# Patient Record
Sex: Female | Born: 1940 | Race: White | Hispanic: No | Marital: Married | Smoking: Never smoker
Health system: Southern US, Community
[De-identification: ages and names within clinical notes are randomized; demographics above are authoritative.]

## PROBLEM LIST (undated history)

## (undated) DIAGNOSIS — M1990 Unspecified osteoarthritis, unspecified site: Secondary | ICD-10-CM

## (undated) DIAGNOSIS — I10 Essential (primary) hypertension: Secondary | ICD-10-CM

## (undated) DIAGNOSIS — I714 Abdominal aortic aneurysm, without rupture, unspecified: Secondary | ICD-10-CM

## (undated) HISTORY — DX: Unspecified osteoarthritis, unspecified site: M19.90

## (undated) HISTORY — DX: Abdominal aortic aneurysm, without rupture, unspecified: I71.40

## (undated) HISTORY — DX: Abdominal aortic aneurysm, without rupture: I71.4

## (undated) HISTORY — PX: KNEE SURGERY: SHX244

## (undated) HISTORY — DX: Essential (primary) hypertension: I10

---

## 2002-05-09 ENCOUNTER — Encounter: Payer: Self-pay | Admitting: Orthopedic Surgery

## 2002-05-14 ENCOUNTER — Inpatient Hospital Stay (HOSPITAL_COMMUNITY): Admission: RE | Admit: 2002-05-14 | Discharge: 2002-05-16 | Payer: Self-pay | Admitting: Orthopedic Surgery

## 2002-05-14 ENCOUNTER — Encounter: Payer: Self-pay | Admitting: Orthopedic Surgery

## 2004-03-06 HISTORY — PX: JOINT REPLACEMENT: SHX530

## 2005-03-06 HISTORY — PX: SPINE SURGERY: SHX786

## 2005-03-06 HISTORY — PX: BACK SURGERY: SHX140

## 2009-08-17 ENCOUNTER — Encounter: Admission: RE | Admit: 2009-08-17 | Discharge: 2009-08-17 | Payer: Self-pay | Admitting: Orthopedic Surgery

## 2009-10-18 ENCOUNTER — Ambulatory Visit: Payer: Self-pay | Admitting: Surgery

## 2010-07-19 ENCOUNTER — Other Ambulatory Visit: Payer: Self-pay | Admitting: Surgery

## 2010-07-19 DIAGNOSIS — I714 Abdominal aortic aneurysm, without rupture: Secondary | ICD-10-CM

## 2010-07-19 NOTE — Assessment & Plan Note (Signed)
OFFICE VISIT   Tiffany Dunn, Tiffany Dunn  DOB:  08-27-1940                                       10/18/2009  VWUJW#:[id number]   REASON FOR VISIT:  Aneurysm.   REFERRING PHYSICIAN:  Windy Fast A. Darrelyn Hillock, M.D.   PRIMARY CARE PHYSICIAN:  Dr. Rosalio Macadamia   HISTORY:  This is a 70 year old female seen at the request of Dr.  Darrelyn Hillock for evaluation of aortic aneurysm.  The patient was picked up  incidentally with imaging.  MRA suggests an aortic root measuring 3.5  cm, distal aortic arch at 2.9 cm, proximal thoracic aorta at 3.1 cm, and  a 2.7-cm juxtarenal visceral aorta.  The patient has no symptoms related  to her aneurysm.  She has very minimal risk factors.  She has minimal  hypertension which does not need medical treatment.  Per the patient,  her cholesterol has been well controlled and she is not a smoker.   REVIEW OF SYSTEMS:  Positive only for arthritis.  All other review of  systems are negative.   PAST MEDICAL HISTORY:  History of knee and back surgery.   SOCIAL HISTORY:  She is married.  She is retired.  Does not drink or  smoke.   FAMILY HISTORY:  Positive for hypertension in her father.   MEDICATIONS:  1. Fish Oil.  2. Baby aspirin.  3. Aleve.  4. Vitamin D and calcium.   ALLERGIES:  ULTRAM.   PHYSICAL EXAMINATION:  Heart rate 63, blood pressure 171/94, respiratory  rate 20.  General:  Well-appearing, no distress.  HEENT:  Within normal  limits.  Lungs:  Clear bilaterally.  Cardiovascular:  Regular rhythm.  No carotid bruits.  No pulsatile abdominal mass.  Abdomen:  Soft,  nontender.  No hepatosplenomegaly.  Musculoskeletal:  Without major  deformities.  Neuro:  She has no focal deficits or weakness.  Skin:  Without rash.  Extremities:  Warm and well perfused.   ASSESSMENT/PLAN:  Thoracic and aortic aneurysmal degeneration; largest  diameter is 3.1 cm.   PLAN:  I discussed the significance of the above findings.  The high  risk of rupture is  extremely low at this size.  We will need to follow  this over time as it could progress.  I told her I would like to see her  back in 1 year with a CT angiogram of the chest, abdomen, and pelvis.     Jorge Ny, MD  Electronically Signed   VWB/MEDQ  D:  10/18/2009  T:  10/19/2009  Job:  [id number]   cc:   Georges Lynch. Darrelyn Hillock, M.D.  Rosalio Macadamia

## 2010-07-22 NOTE — H&P (Signed)
NAME:  Tiffany Dunn, Tiffany Dunn                           ACCOUNT NO.:  [id number]   MEDICAL RECORD NO.:  [id number]                   PATIENT TYPE:  INP   LOCATION:  NA                                   FACILITY:  [facility]   PHYSICIAN:  Georges Lynch. Darrelyn Hillock, M.D.             DATE OF BIRTH:  02/21/1941   DATE OF ADMISSION:  05/14/2002  DATE OF DISCHARGE:                                HISTORY & PHYSICAL   PRIMARY CARE PHYSICIAN:  Dr. Danne Harbor in [place].   HISTORY:  The patient has had low back pain for several months.  The patient  radiates into both buttocks and down the posterior aspects of both thighs.  The pain is worse with standing.  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  Due to  increasing pain and difficulty ambulating, the patient had elected to  proceed with surgery.   ALLERGIES:  ULTRAM causes nausea and vomiting.   MEDICATIONS:  1. Fosamax 70 mg weekly.  2. Vioxx 25 mg daily.   PAST MEDICAL HISTORY:  1. Osteoporosis.  2. Osteoarthritis.   PAST SURGICAL HISTORY:  1. The patient had removal of a benign left breast cyst in 1984.  2. D&C in 1989.  3. Knee surgery in 2002.   FAMILY HISTORY:  Father had heart disease.  Mother died of a stroke.   REVIEW OF SYSTEMS:  GENERAL:  Denies weight change, fever, chills, fatigue.  HEENT:  Denies headache, visual changes, tinnitus, hearing loss, or sore  throat, hoarseness.  CARDIOVASCULAR:  Denies chest pain, palpitations,  shortness of breath, orthopnea.  PULMONARY:  Denies dyspnea, wheezing,  cough, sputum production, or hemoptysis.  GI:  Denies dysphagia, nausea,  vomiting, hematemesis, or abdominal pain. GU:  Denies dysuria, frequency,  urgency, or  hematuria.  ENDOCRINE:  Denies polyuria, polydipsia, appetite  change, heat or cold intolerance.  MUSCULOSKELETAL:  Back exam:  The patient  does have low back pain radiating into her buttocks. NEUROLOGICAL:  Denies  dizziness, vertigo, syncope, or seizure, weakness.  SKIN:  Denies itching,  rashes, masses, or moles.   PHYSICAL EXAMINATION:  VITAL SIGNS:  Temperature 97.3, pulse 72,  respirations 18, blood pressure 110/70 right arm sitting.  GENERAL:  A 70 year old female in no acute distress.  HEENT:  PERRL, EOMs intact, pharynx clear, TMs intact.  NECK:  Supple without masses, no carotid bruits detected.  CHEST:  Clear to auscultation bilaterally.  No wheezing, rales, or rhonchi  noted.  HEART:  Regular rate and rhythm without murmur, gallop, or rub.  ABDOMEN:  Positive bowel sounds.  Soft, nontender.  No organomegaly or  abnormal masses.  BACK:  The patient has decreased range of motion, pain in the lumbar area.  Straight leg raise is negative bilaterally.  NEUROLOGICAL:  She is intact.  Ankle jerks are absent bilaterally.  Knee  jerks  are intact and equal bilaterally.  SKIN:  Warm and dry.   IMAGING STUDIES:  X-ray LS spine she has old compression fracture T12.  MRI  of LS spine reveals severe L4-L5 stenosis with a large synovial cyst L4-L5.   PLAN:  The patient is to be admitted to [facility] May 14, 2002  for a central decompression L4-L5.     Ebbie Ridge. Paitsel, P.A.                     Ronald A. Darrelyn Hillock, M.D.    Tilden Dome  D:  05/12/2002  T:  05/12/2002  Job:  [id number]

## 2010-07-22 NOTE — Op Note (Signed)
NAME:  Tiffany Dunn, Tiffany Dunn                           ACCOUNT NO.:  [id number]   MEDICAL RECORD NO.:  [id number]                   PATIENT TYPE:  INP   LOCATION:  0004                                 FACILITY:  [facility]   PHYSICIAN:  Georges Lynch. Darrelyn Hillock, M.D.             DATE OF BIRTH:  23-Oct-1940   DATE OF PROCEDURE:  05/14/2002  DATE OF DISCHARGE:                                 OPERATIVE REPORT   SURGEON:  Georges Lynch. Darrelyn Hillock, M.D.   ASSISTANT:  Jene Every, M.D.   PREOPERATIVE DIAGNOSES:  1. 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  2. Severe spinal stenosis at L4-5.   POSTOPERATIVE DIAGNOSES:  1. 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  2. Severe spinal stenosis at L4-5.   OPERATION:  1. Bilateral complete decompressive lumbar laminectomy at L4-5.  2. Excision of a large synovial cyst at L4-5 on the right.   DESCRIPTION OF PROCEDURE:  Under general anesthesia, a routine orthopedic  prep and draping of the lower back was carried out.  The patient had 1 g of  IV Ancef.  Two needles were placed in the back for localization purposes,  and x-ray was taken.  Follow up x-ray also was taken to further localize the  area at L4-5.  Once I identified the L4 and L5 spinous processes, I stripped  the muscle from the lamina and spinous processes in the usual fashion.  We  did take another x-ray at this time.  I then inserted the [facility]  retractors.  I then remove the spinous processes of L4, a portion of L3  above, and a portion of L5 below.  I did a complete central decompression  for severe spinal stenosis.  Microscope was brought in.  We then removed the  ligamentum flavum.  We then noticed that on the right side, there was a  severe compression of the thecal sac secondary to a large synovial cyst.  We  gently dissected the cyst from the thecal sac and then did a wide  decompression of the lateral recess and foraminotomies and removed the cyst  in toto.  We then did  foraminotomies and decompressed the recess on the  opposite side.  The wound was thoroughly irrigated.  Good hemostasis was  maintained.  I then loosely applied some thrombin-soaked Gelfoam.  We then  inserted a quarter-inch Penrose drain.  Following this, the wound then was  closed in layers in the usual fashion.  Sterile dressings were applied.  Note, I left a small portion of the deep portion of the fascia open in order  for drainage as well.  Sterile dressings were finally put on after the skin  was closed, and the patient left the operating room in satisfactory  condition.  Prior to leaving surgery, she had 30 mg of IV Toradol.  Ronald A. Darrelyn Hillock, M.D.    RAG/MEDQ  D:  05/14/2002  T:  05/14/2002  Job:  [id number]

## 2010-09-26 ENCOUNTER — Encounter: Payer: Self-pay | Admitting: Surgery

## 2010-10-17 ENCOUNTER — Inpatient Hospital Stay: Admission: RE | Admit: 2010-10-17 | Payer: Self-pay | Source: Ambulatory Visit

## 2010-10-17 ENCOUNTER — Ambulatory Visit: Payer: Self-pay | Admitting: Surgery

## 2010-10-24 ENCOUNTER — Encounter: Payer: Self-pay | Admitting: Surgery

## 2010-11-18 ENCOUNTER — Encounter: Payer: Self-pay | Admitting: Surgery

## 2010-11-21 ENCOUNTER — Ambulatory Visit (INDEPENDENT_AMBULATORY_CARE_PROVIDER_SITE_OTHER): Payer: Medicare Other | Admitting: Surgery

## 2010-11-21 ENCOUNTER — Encounter: Payer: Self-pay | Admitting: Surgery

## 2010-11-21 ENCOUNTER — Encounter (INDEPENDENT_AMBULATORY_CARE_PROVIDER_SITE_OTHER): Payer: Medicare Other | Admitting: Vascular Surgery

## 2010-11-21 VITALS — BP 151/72 | HR 66 | Ht 63.0 in | Wt 170.0 lb

## 2010-11-21 DIAGNOSIS — I712 Thoracic aortic aneurysm, without rupture, unspecified: Secondary | ICD-10-CM

## 2010-11-21 NOTE — Progress Notes (Unsigned)
AAA U/S performed VVS 11/21/2010

## 2010-11-21 NOTE — Progress Notes (Signed)
CC:  Thoracic and abdominal aneurysm HPI:  This is a 70 year old female that comes back today for followup for thoracic and abdominal aortic aneurysms. I saw her one year ago at the request of Dr.Gioffre. She had undergone a MRA which suggested aortic root dilation of 3.5 cm. The distal aortic arch measured 2.9 cm and the proximal thoracic aorta measured 3.1 cm. 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 The patient is back today for followup. She's had no new complaints. Her only risk factor has been hypertension which has been medically managed. She's recently started on ACE inhibitor. Her cholesterol has been well-controlled and she is a nonsmoker.  ROS:  No change from 10/18/2009 visit  Past Medical History  Diagnosis Date  . Arthritis   . AAA (abdominal aortic aneurysm)   . Hypertension     History  Substance Use Topics  . Smoking status: Never Smoker   . Smokeless tobacco: Not on file  . Alcohol Use: No    Family History  Problem Relation Age of Onset  . Heart disease Father     Allergies  Allergen Reactions  . Ultram (Tramadol Hcl)     Current outpatient prescriptions:aspirin 81 MG tablet, Take 81 mg by mouth daily.  , Disp: , Rfl: ;  Calcium Carbonate-Vit D-Min (CALCIUM 1200 PO), Take by mouth. 1200 mg , Disp: , Rfl: ;  lisinopril (PRINIVIL,ZESTRIL) 20 MG tablet, Take 20 mg by mouth daily.  , Disp: , Rfl: ;  Naproxen Sodium (ALEVE PO), Take by mouth. two , Disp: , Rfl: ;  Nutritional Supplements (VITAMIN D BOOSTER PO), Take by mouth. 600 mg , Disp: , Rfl:  Omega-3 Fatty Acids (FISH OIL) 1200 MG CAPS, Take by mouth.  , Disp: , Rfl:   BP 151/72  Pulse 66  Ht 5\' 3"  (1.6 m)  Wt 170 lb (77.111 kg)  BMI 30.11 kg/m2  SpO2 98%  Body mass index is 30.11 kg/(m^2).   Physical exam: General: Well-appearing in no acute distress HEENT: Within normal limits Cardiovascular: Regular rate and rhythm no murmur no carotid bruits pedal pulses are not palpable Abdomen: Soft  nontender Pulmonary: Clear to auscultation no wheezes or rhonchi  Diagnostic studies: Duplex was performed today that showed maximum aortic diameter of 2.7 cm.  Assessment: Thoracic and abdominal aortic aneurysm Plan: The patient was denied by her insurance coming to have CT imaging of her thoracic aorta. There is no other way to evaluate her thoracic aneurysm and so and therefore re\re ordering this to be done in one year to see if her aneurysm has increased in size. Maximum diameter by MR was 3.1 cm. I will continue to follow her abdominal aneurysm with the ultrasound. I'll see her back in a year

## 2010-11-21 NOTE — Progress Notes (Signed)
Addended by: Sharee Pimple on: 11/21/2010 11:06 AM   Modules accepted: Orders

## 2010-12-05 NOTE — Procedures (Unsigned)
DUPLEX ULTRASOUND OF ABDOMINAL AORTA  INDICATION:  Followup thoracic aortic aneurysm.  HISTORY: Diabetes:  No. Cardiac:  No. Hypertension:  Yes. Smoking: Connective Tissue Disorder: Family History:  No. Previous Surgery:  No.  DUPLEX EXAM:         AP (cm)                   TRANSVERSE (cm) Proximal             2.81 cm                   2.68 cm Mid                  2.71 cm                   2.61 cm Distal               1.89 cm                   1.77 cm Right Iliac          1.04 cm                   1.12 cm Left Iliac           0.90 cm                   1.19 cm  PREVIOUS:  Date:  MRA from 2011 juxtarenal  AP:  2.7  TRANSVERSE:  IMPRESSION:  No evidence of abdominal aortic aneurysmal dilatations identified.  ___________________________________________ V. Charlena Cross, MD  SH/MEDQ  D:  11/21/2010  T:  11/21/2010  Job:  [id number]

## 2011-10-19 ENCOUNTER — Other Ambulatory Visit: Payer: Self-pay | Admitting: *Deleted

## 2011-10-19 DIAGNOSIS — I712 Thoracic aortic aneurysm, without rupture: Secondary | ICD-10-CM

## 2011-10-19 DIAGNOSIS — Z48812 Encounter for surgical aftercare following surgery on the circulatory system: Secondary | ICD-10-CM

## 2011-11-20 ENCOUNTER — Ambulatory Visit: Payer: Medicare Other | Admitting: Surgery

## 2011-11-20 ENCOUNTER — Other Ambulatory Visit: Payer: Medicare Other

## 2011-11-23 ENCOUNTER — Other Ambulatory Visit: Payer: Self-pay | Admitting: Surgery

## 2011-11-24 ENCOUNTER — Encounter: Payer: Self-pay | Admitting: Surgery

## 2011-11-27 ENCOUNTER — Ambulatory Visit
Admission: RE | Admit: 2011-11-27 | Discharge: 2011-11-27 | Disposition: A | Discharge status: Home / Self Care | Payer: Medicare Other | Source: Ambulatory Visit | Attending: Surgery | Admitting: Surgery

## 2011-11-27 ENCOUNTER — Ambulatory Visit (INDEPENDENT_AMBULATORY_CARE_PROVIDER_SITE_OTHER): Payer: Medicare Other | Admitting: Surgery

## 2011-11-27 ENCOUNTER — Other Ambulatory Visit: Payer: Self-pay | Admitting: *Deleted

## 2011-11-27 ENCOUNTER — Encounter: Payer: Self-pay | Admitting: Surgery

## 2011-11-27 ENCOUNTER — Ambulatory Visit: Payer: Medicare Other | Admitting: Surgery

## 2011-11-27 ENCOUNTER — Encounter (INDEPENDENT_AMBULATORY_CARE_PROVIDER_SITE_OTHER): Payer: Medicare Other | Admitting: *Deleted

## 2011-11-27 VITALS — BP 143/74 | HR 57 | Resp 16 | Ht 62.0 in | Wt 182.8 lb

## 2011-11-27 DIAGNOSIS — I714 Abdominal aortic aneurysm, without rupture, unspecified: Secondary | ICD-10-CM | POA: Insufficient documentation

## 2011-11-27 DIAGNOSIS — I712 Thoracic aortic aneurysm, without rupture, unspecified: Secondary | ICD-10-CM

## 2011-11-27 DIAGNOSIS — Z48812 Encounter for surgical aftercare following surgery on the circulatory system: Secondary | ICD-10-CM

## 2011-11-27 DIAGNOSIS — I771 Stricture of artery: Secondary | ICD-10-CM

## 2011-11-27 MED ORDER — IOHEXOL 350 MG/ML SOLN
80.0000 mL | Freq: Once | INTRAVENOUS | Status: AC | PRN
  Administered 2011-11-27: 80 mL via INTRAVENOUS

## 2011-11-27 NOTE — Progress Notes (Signed)
[facility] and [facility] Specialist of [facility]   Patient name: Tiffany Dunn MRN: [id number] DOB: January 12, 1941 Sex: female     Chief Complaint  Patient presents with  . AAA    One year f/up  TAA/AAA with vascular lab study today    HISTORY OF PRESENT ILLNESS: The patient is back today for followup of her thoracic and abdominal aortic aneurysm. These were detected when she underwent a MRA. The graft was brought risk factors include hypertension. She is medically managed for this. Her cholesterol is been well controlled and she is a nonsmoker. Duplex in the past has shown a abdominal aortic aneurysm with maximum diameter of 2.7. There was aortic root dilation of 3.5 cm and a thoracic aneurysm with maximum diameter of 3.1 cm. 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 She has had no complaints since I last saw her.  Past Medical History  Diagnosis Date  . Arthritis   . AAA (abdominal aortic aneurysm)   . Hypertension     Past Surgical History  Procedure Date  . Back surgery   . Knee surgery   . Spine surgery 2007    Back  . Joint replacement 2006    Bilateral Knee    History   Social History  . Marital Status: Married    Spouse Name: N/A    Number of Children: N/A  . Years of Education: N/A   Occupational History  . Not on file.   Social History Main Topics  . Smoking status: Never Smoker   . Smokeless tobacco: Not on file  . Alcohol Use: No  . Drug Use: No  . Sexually Active:    Other Topics Concern  . Not on file   Social History Narrative  . No narrative on file    Family History  Problem Relation Age of Onset  . Heart disease Father   . Heart attack Father   . Hypertension Father     Allergies as of 11/27/2011 - Review Complete 11/27/2011  Allergen Reaction Noted  . Ultram (tramadol hcl) Anaphylaxis and Nausea And Vomiting 09/26/2010    Current Outpatient Prescriptions on File Prior to Visit  Medication Sig Dispense Refill  . aspirin 81  MG tablet Take 81 mg by mouth daily.        . Calcium Carbonate-Vit D-Min (CALCIUM 1200 PO) Take by mouth. 1200 mg       . lisinopril (PRINIVIL,ZESTRIL) 20 MG tablet Take 20 mg by mouth daily.        . Naproxen Sodium (ALEVE PO) Take by mouth. two       . Nutritional Supplements (VITAMIN D BOOSTER PO) Take by mouth. 600 mg       . Omega-3 Fatty Acids (FISH OIL) 1200 MG CAPS Take by mouth.         No current facility-administered medications on file prior to visit.     REVIEW OF SYSTEMS: No change from prior visit  PHYSICAL EXAMINATION:   Vital signs are BP 143/74  Pulse 57  Resp 16  Ht 5\' 2"  (1.575 m)  Wt 182 lb 12.8 oz (82.918 kg)  BMI 33.43 kg/m2  SpO2 95% General: The patient appears their stated age. HEENT:  No gross abnormalities Pulmonary:  Non labored breathing Abdomen: Soft and non-tender Musculoskeletal: There are no major deformities. Neurologic: No focal weakness or paresthesias are detected, Skin: There are no ulcer or rashes noted. Psychiatric: The patient has normal affect. Cardiovascular: There is a regular  rate and rhythm without significant murmur appreciated. I cannot palpate pedal pulses. No carotid bruits.   Diagnostic Studies  duplex ultrasound: 3.6 cm juxtarenal abdominal aortic aneurysm CT angiogram: AB right subclavian without aneurysmal changes maximum descending thoracic aneurysm is 3.7. Distal thoracic aorta measures 3.4 cm the supraceliac aorta measures 2.9 cm the juxtarenal aorta is normal in caliber measuring 2.7 cm.  Assessment: Thoracic aortic aneurysm. 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 She does have aneurysmal changes to the descending thoracic aorta with maximum diameter of 3.7 cm. By CT scan I do not see evidence of a juxtarenal aneurysm, but rather a distal thoracic aneurysm. Again maximum diameter of the thoracic aorta 3.7 cm. There has been a  small increase in size of her last study. [place] this can be evaluated is with a CT scan. I am going to try and minimize the amount of radiation exposure she gets and therefore I will await to get additional imaging for 2 years. At that time she will have a CT angiogram of the chest. She will also get an ultrasound of her, aorta. I will see her back in 2 years  V. Charlena Cross, M.D. [facility] [place] Office: [phone] Pager:  [phone]

## 2011-11-27 NOTE — Addendum Note (Signed)
Addended by: Sharee Pimple on: 11/27/2011 01:36 PM   Modules accepted: Orders

## 2013-03-18 ENCOUNTER — Other Ambulatory Visit: Payer: Self-pay | Admitting: *Deleted

## 2013-03-18 DIAGNOSIS — I716 Thoracoabdominal aortic aneurysm, without rupture, unspecified: Secondary | ICD-10-CM

## 2013-03-18 DIAGNOSIS — Z48812 Encounter for surgical aftercare following surgery on the circulatory system: Secondary | ICD-10-CM

## 2013-03-28 ENCOUNTER — Telehealth: Payer: Self-pay | Admitting: Surgery

## 2013-03-28 NOTE — Telephone Encounter (Signed)
Message copied by Margaretmary EddyFITZPATRICK, DOROTHY K on Fri Mar 28, 2013 10:47 AM ------      Message from: Fredrich BirksMILLIKAN, DANA P      Created: Fri Mar 28, 2013 10:15 AM      Regarding: Questioning Appointment       Olegario MessierKathy,            Please call Tiffany HomerMary Dunn regarding an appointment that was scheduled for 09/2013 with Rosalita ChessmanSuzanne Nickel. She states that no one called her about this appointment and she would like further explanation. She can be reached at [phone].            Thank you,      Annabelle Harmanana ------

## 2013-03-28 NOTE — Telephone Encounter (Signed)
Spoke with pt. Explained that insurance requires pt to be seen when gap between CTA visits are longer than 1 year. Pt confirmed understanding.

## 2013-09-08 ENCOUNTER — Ambulatory Visit: Payer: Self-pay | Admitting: Surgery

## 2013-09-19 ENCOUNTER — Encounter: Payer: Self-pay | Admitting: Family

## 2013-09-22 ENCOUNTER — Ambulatory Visit (INDEPENDENT_AMBULATORY_CARE_PROVIDER_SITE_OTHER): Payer: Medicare Other | Admitting: Family

## 2013-09-22 ENCOUNTER — Encounter: Payer: Self-pay | Admitting: Family

## 2013-09-22 VITALS — BP 133/73 | HR 58 | Temp 97.4°F | Resp 16 | Ht 62.5 in | Wt 184.8 lb

## 2013-09-22 DIAGNOSIS — I714 Abdominal aortic aneurysm, without rupture, unspecified: Secondary | ICD-10-CM

## 2013-09-22 NOTE — Progress Notes (Signed)
[facility] [place]  Established Abdominal Aortic Aneurysm  History of Present Illness  Tiffany Dunn is a 73 y.o. ([birth year]) female patient of Dr. Myra Gianotti The patient is back today for her 2 year followup of her thoracic and abdominal aortic aneurysm. These were detected when she underwent a MRA.  She is to have a CT angiogram of the chest and  an ultrasound of her, aorta as planned 2 years ago when she saw Dr. Myra Gianotti.  She states she is scheduled in September for CT angiogram of her chest and to see Dr. Myra Gianotti later that day.  She did not call to schedule this appointment and is not having any problems. On investigation of documentation it appears that that insurance requires pt to be seen when gap between CTA visits are longer than 1 year.  Her cholesterol is been well controlled and she is a nonsmoker. Duplex in the past has shown a abdominal aortic aneurysm with maximum diameter of 2.7. There was aortic root dilation of 3.5 cm and a thoracic aneurysm with maximum diameter of 3.1 cm. She exercises 3-4 times/week. She states her HDL is high, in the 80's, and therefore does not need a statin.    The patient does denies back, chest, or abdominal pain.   She does report OA pain in her shoulders and c-spine, has some tingling and numbness in her right fingertips.  The patient denies claudication in legs with walking, denies non healing wounds. The patient denies history of stroke or TIA symptoms, denies cardiac problems. She takes a daily ASA and no other antiplatelet or anticoagulant medication.  Pt Diabetic: No   Past Medical History  Diagnosis Date  . Arthritis   . AAA (abdominal aortic aneurysm)   . Hypertension    Past Surgical History  Procedure Laterality Date  . Back surgery    . Knee surgery    . Spine surgery  2007    Back  . Joint replacement  2006    Bilateral Knee   Social History History   Social History  . Marital Status: Married   Spouse Name: N/A    Number of Children: N/A  . Years of Education: N/A   Occupational History  . Not on file.   Social History Main Topics  . Smoking status: Never Smoker   . Smokeless tobacco: Not on file  . Alcohol Use: No  . Drug Use: No  . Sexual Activity:    Other Topics Concern  . Not on file   Social History Narrative  . No narrative on file   Family History Family History  Problem Relation Age of Onset  . Heart disease Father   . Heart attack Father   . Hypertension Father     Current Outpatient Prescriptions on File Prior to Visit  Medication Sig Dispense Refill  . aspirin 81 MG tablet Take 81 mg by mouth daily.        . Calcium Carbonate-Vit D-Min (CALCIUM 1200 PO) Take by mouth. 1200 mg       . lisinopril (PRINIVIL,ZESTRIL) 20 MG tablet Take 20 mg by mouth daily.        . Nutritional Supplements (VITAMIN D BOOSTER PO) Take by mouth. 600 mg       . Omega-3 Fatty Acids (FISH OIL) 1200 MG CAPS Take by mouth.        . Naproxen Sodium (ALEVE PO) Take by mouth. two        No  current facility-administered medications on file prior to visit.   Allergies  Allergen Reactions  . Ultram [Tramadol Hcl] Anaphylaxis and Nausea And Vomiting    ROS: See HPI for pertinent positives and negatives.  Physical Examination  Filed Vitals:   09/22/13 1309  BP: 133/73  Pulse: 58  Temp: 97.4 F (36.3 C)  Resp: 16  Height: 5' 2.5" (1.588 m)  Weight: 184 lb 12.8 oz (83.825 kg)   Body mass index is 33.24 kg/(m^2).  General: A&O x 3, WD, obese female.  Pulmonary: Sym exp, good air movt, CTAB, no rales, rhonchi, or wheezing.  Cardiac: RRR, Nl S1, S2, no detected murmur.   Carotid Bruits Left Right   Negative Negative   Aorta is not palpable Radial pulses are 2+ and =                          VASCULAR EXAM:                                                                                                         LE Pulses LEFT RIGHT       FEMORAL  palpable  palpable         POPLITEAL  not palpable   not palpable       POSTERIOR TIBIAL  not palpable   not palpable        DORSALIS PEDIS      ANTERIOR TIBIAL 1+ palpable  faintly palpable     Gastrointestinal: soft, NTND, -G/R, - HSM, - masses, - CVAT B.  Musculoskeletal: M/S 5/5 throughout, Extremities without ischemic changes.  Neurologic: CN 2-12 grossly intact, Pain and light touch intact in extremities are intact except, Motor exam as listed above.   Medical Decision Making  The patient is a 73 y.o. female who presents with asymptomatic AAA. She states that she is scheduled [facility], and scheduled to see Dr. Myra GianottiBrabham later that day. If she is not already scheduled to have a AAA Duplex [facility] the same day as her chest CT, will schedule her in our lab for this the same day.    Consideration for repair of AAA would be made when the size approaches 4.8 or 5.0 cm, growth > 1 cm/yr, and symptomatic status.  I emphasized the importance of maximal medical management including strict control of blood pressure, blood glucose, and lipid levels, antiplatelet agents, obtaining regular exercise, and continued cessation of smoking.   The patient was given information about AAA including signs, symptoms, treatment, and how to minimize the risk of enlargement and rupture of aneurysms.    The patient was advised to call 911 should the patient experience sudden onset abdominal, chest, or back pain.   Thank you for allowing us to participate in this patient's care.  Charisse MarchSuzanne Atalia Litzinger, RN, MSN, FNP-C [facility] [place] Office: [phone]  Clinic Physician: Imogene BurnChen  09/22/2013, 1:15 PM

## 2013-09-22 NOTE — Patient Instructions (Signed)
Abdominal Aortic Aneurysm An aneurysm is a weakened or damaged part of an artery wall that bulges from the normal force of blood pumping through the body. An abdominal aortic aneurysm is an aneurysm that occurs in the lower part of the aorta, the main artery of the body.  The major concern with an abdominal aortic aneurysm is that it can enlarge and burst (rupture) or blood can flow between the layers of the wall of the aorta through a tear (aorticdissection). Both of these conditions can cause bleeding inside the body and can be life threatening unless diagnosed and treated promptly. CAUSES  The exact cause of an abdominal aortic aneurysm is unknown. Some contributing factors are:   A hardening of the arteries caused by the buildup of fat and other substances in the lining of a blood vessel (arteriosclerosis).  Inflammation of the walls of an artery (arteritis).   Connective tissue diseases, such as Marfan syndrome.   Abdominal trauma.   An infection, such as syphilis or staphylococcus, in the wall of the aorta (infectious aortitis) caused by bacteria. RISK FACTORS  Risk factors that contribute to an abdominal aortic aneurysm may include:  Age older than 60 years.   High blood pressure (hypertension).  Female gender.  Ethnicity (white race).  Obesity.  Family history of aneurysm (first degree relatives only).  Tobacco use. PREVENTION  The following healthy lifestyle habits may help decrease your risk of abdominal aortic aneurysm:  Quitting smoking. Smoking can raise your blood pressure and cause arteriosclerosis.  Limiting or avoiding alcohol.  Keeping your blood pressure, blood sugar level, and cholesterol levels within normal limits.  Decreasing your salt intake. In somepeople, too much salt can raise blood pressure and increase your risk of abdominal aortic aneurysm.  Eating a diet low in saturated fats and cholesterol.  Increasing your fiber intake by including  whole grains, vegetables, and fruits in your diet. Eating these foods may help lower blood pressure.  Maintaining a healthy weight.  Staying physically active and exercising regularly. SYMPTOMS  The symptoms of abdominal aortic aneurysm may vary depending on the size and rate of growth of the aneurysm.Most grow slowly and do not have any symptoms. When symptoms do occur, they may include:  Pain (abdomen, side, lower back, or groin). The pain may vary in intensity. A sudden onset of severe pain may indicate that the aneurysm has ruptured.  Feeling full after eating only small amounts of food.  Nausea or vomiting or both.  Feeling a pulsating lump in the abdomen.  Feeling faint or passing out. DIAGNOSIS  Since most unruptured abdominal aortic aneurysms have no symptoms, they are often discovered during diagnostic exams for other conditions. An aneurysm may be found during the following procedures:  Ultrasonography (A one-time screening for abdominal aortic aneurysm by ultrasonography is also recommended for all men aged 65-75 years who have ever smoked).  X-ray exams.  A computed tomography (CT).  Magnetic resonance imaging (MRI).  Angiography or arteriography. TREATMENT  Treatment of an abdominal aortic aneurysm depends on the size of your aneurysm, your age, and risk factors for rupture. Medication to control blood pressure and pain may be used to manage aneurysms smaller than 6 cm. Regular monitoring for enlargement may be recommended by your caregiver if:  The aneurysm is 3-4 cm in size (an annual ultrasonography may be recommended).  The aneurysm is 4-4.5 cm in size (an ultrasonography every 6 months may be recommended).  The aneurysm is larger than 4.5 cm in   size (your caregiver may ask that you be examined by a vascular surgeon). If your aneurysm is larger than 6 cm, surgical repair may be recommended. There are two main methods for repair of an aneurysm:   Endovascular  repair (a minimally invasive surgery). This is done most often.  Open repair. This method is used if an endovascular repair is not possible. Document Released: 11/30/2004 Document Revised: 06/17/2012 Document Reviewed: 03/22/2012 Exit[employer] Patient Information 2015 [employer]. This information is not intended to replace advice given to you by your health care provider. Make sure you discuss any questions you have with your health care provider.     Thoracic Aortic Aneurysm An aneurysm is a bulge in an artery. It happens when the wall of the artery is weakened or damaged. If the aneurysm gets too big, it bursts (ruptures) and severe bleeding occurs. A thoracic aortic aneurysm is an aneurysm that occurs in the first part of the aorta, between the heart and the diaphragm. The aorta is the main artery and supplies blood from the heart to the rest of the body. A thoracic aortic aneurysm can enlarge and rupture or blood can flow between the layers of the wall of the aorta through a tear (aorticdissection). Both of these conditions can cause bleeding inside the body and can be life threatening unless diagnosed and treated promptly. CAUSES  The exact cause of a thoracic aortic aneurysm is often unknown. Some contributing factors are:   A hardening of the arteries caused by the buildup of fat and other substances in the lining of a blood vessel (arteriosclerosis).  Inflammation of the walls of an artery (arteritis).  Connective tissue diseases, such as Marfan syndrome.  Injury or trauma to the aorta.  An infection, such as syphilis or staphylococcus, in the wall of the aorta (infectious aortitis) caused by bacteria. RISK FACTORS  Risk factors that contribute to a thoracic aortic aneurysm may include:  Age older than 60 years.  High blood pressure (hypertension).  Female gender.  Ethnicity (white race).  Obesity.  Family history of aneurysm (first degree relatives only).  Tobacco  use. PREVENTION  The following healthy lifestyle habits may help decrease your risk of a thoracic aortic aneurysm:  Quitting smoking. Smoking can raise your blood pressure and cause arteriosclerosis.  Limiting or avoiding alcohol.  Keeping your blood pressure, blood sugar level, and cholesterol levels within normal limits.  Decreasing your salt intake. In some people, too much salt can raise blood pressure and increase your risk of abdominal aortic aneurysm.  Eating a diet low in saturated fats and cholesterol.  Increasing your fiber intake by including whole grains, vegetables, and fruits in your diet. Eating these foods may help lower blood pressure.  Maintaining a healthy weight.  Staying physically active and exercising regularly. SYMPTOMS  The symptoms of thoracic aortic aneurysm may vary depending on the size and rate of growth of the aneurysm. Most grow slowly and do not have any symptoms. When symptoms do occur, they may include:  Pain (chest, back, sides, or abdomen). The pain may vary in intensity. A sudden onset of severe pain may indicate that the aneurysm has ruptured.  Hoarseness.  Cough.  Shortness of breath.  Swallowing problems.  Nausea or vomiting or both. DIAGNOSIS  Since most unruptured thoracic aortic aneurysms have no symptoms, they are often discovered during diagnostic exams for other conditions. An aneurysm may be found during the following procedures:  Ultrasonography (a one-time screening for thoracic aortic aneurysm by   ultrasonography is also recommended for all men aged 65-75 years who have ever smoked).  X-ray exams.  A CT scan.  An MRI.  Angiography or arteriography. TREATMENT  Treatment of a thoracic aortic aneurysm depends on the size of your aneurysm, your age, and risk factors for rupture. Medicine to control blood pressure and pain may be used to manage aneurysms smaller than 2.3 in (6 cm). Regular monitoring for enlargement may be  recommended by your health care provider if:  The aneurysm is 1.2-1.5 in (3-4 cm) in size (an annual ultrasonography may be recommended).  The aneurysm is 1.5-1.8 in (4-4.5 cm) in size (an ultrasonography every 6 months may be recommended).  The aneurysm is larger than 1.8 in (4.5 cm) in size (your health care provider may ask that you be examined by a vascular surgeon). If your aneurysm is larger than 2.2 in (5.5 cm) or if it is enlarging quickly, surgical repair may be recommended. There are two main methods for repair of an aneurysm:   Endovascular repair (a minimally invasive surgery).  Open repair. This method is used if an endovascular repair is not possible. Document Released: 02/20/2005 Document Revised: 12/11/2012 Document Reviewed: 09/02/2012 Exit[employer] Patient Information 2015 [employer]. This information is not intended to replace advice given to you by your health care provider. Make sure you discuss any questions you have with your health care provider.  

## 2013-10-27 ENCOUNTER — Ambulatory Visit: Payer: Medicare Other | Admitting: Family

## 2013-11-14 ENCOUNTER — Encounter: Payer: Self-pay | Admitting: Surgery

## 2013-11-14 ENCOUNTER — Other Ambulatory Visit: Payer: Self-pay | Admitting: Surgery

## 2013-11-14 LAB — CREATININE, SERUM: CREATININE: 0.91 mg/dL (ref 0.50–1.10)

## 2013-11-14 LAB — BUN: BUN: 21 mg/dL (ref 6–23)

## 2013-11-17 ENCOUNTER — Ambulatory Visit
Admission: RE | Admit: 2013-11-17 | Discharge: 2013-11-17 | Disposition: A | Discharge status: Home / Self Care | Payer: Medicare Other | Source: Ambulatory Visit | Attending: Surgery | Admitting: Surgery

## 2013-11-17 ENCOUNTER — Encounter: Payer: Self-pay | Admitting: Surgery

## 2013-11-17 ENCOUNTER — Ambulatory Visit (HOSPITAL_COMMUNITY)
Admission: RE | Admit: 2013-11-17 | Discharge: 2013-11-17 | Disposition: A | Discharge status: Home / Self Care | Payer: Medicare Other | Source: Ambulatory Visit | Attending: Surgery | Admitting: Surgery

## 2013-11-17 ENCOUNTER — Ambulatory Visit (INDEPENDENT_AMBULATORY_CARE_PROVIDER_SITE_OTHER): Payer: Medicare Other | Admitting: Surgery

## 2013-11-17 VITALS — BP 129/68 | HR 55 | Resp 14 | Ht 62.5 in | Wt 179.0 lb

## 2013-11-17 DIAGNOSIS — I714 Abdominal aortic aneurysm, without rupture, unspecified: Secondary | ICD-10-CM | POA: Insufficient documentation

## 2013-11-17 DIAGNOSIS — Z48812 Encounter for surgical aftercare following surgery on the circulatory system: Secondary | ICD-10-CM

## 2013-11-17 DIAGNOSIS — I716 Thoracoabdominal aortic aneurysm, without rupture, unspecified: Secondary | ICD-10-CM

## 2013-11-17 MED ORDER — IOHEXOL 350 MG/ML SOLN
80.0000 mL | Freq: Once | INTRAVENOUS | Status: AC | PRN
  Administered 2013-11-17: 80 mL via INTRAVENOUS

## 2013-11-17 NOTE — Progress Notes (Signed)
    Established Abdominal Aortic Aneurysm  History of Present Illness  The patient is a 73 y.o. (01/18/1941) female who presents with chief complaint: follow up for AAA and thoracic aneurysm.  Previous studies demonstrate an AAA, measuring Duplex in the past has shown a abdominal aortic aneurysm with maximum diameter of 2.7. There was aortic root dilation of 3.5 cm and a thoracic aneurysm with maximum diameter of 3.1 cm.   The patient does not have back or abdominal pain.  The patient is not a smoker.  She report going to the gym 3 days a week.  She takes an Asprin daily and Lisinopril daily for her hypertension.  She denies DM or hypercholesterolemia.    The patient's PMH, PSH, SH, FamHx, Med, and Allergies are unchanged from 09/22/2013.  Review of Systems  Constitutional: Negative for fever, chills and weight loss.  HENT: Negative for congestion and sore throat.   Eyes: Negative for blurred vision and double vision.  Respiratory: Negative for cough and hemoptysis.   Cardiovascular: Negative for chest pain and palpitations.  Gastrointestinal: Negative for heartburn and nausea.  Genitourinary: Negative for dysuria and urgency.  Musculoskeletal: Negative for joint pain.  Skin: Negative for rash.  Neurological: Negative for dizziness, tremors and headaches.  Endo/Heme/Allergies: Does not bruise/bleed easily.  Psychiatric/Behavioral: Negative for depression.     Physical Examination  Filed Vitals:   11/17/13 1414  BP: 129/68  Pulse: 55  Resp: 14  Height: 5' 2.5" (1.588 m)  Weight: 179 lb (81.194 kg)  SpO2: 100%   Body mass index is 32.2 kg/(m^2).  General: A&O x 3, WD   Pulmonary: Sym exp, good air movt, CTAB   Cardiac: RRR  Vascular: Vessel Right Left  Radial Palpable Palpable  Brachial Palpable Palpable  Carotid Palpable, without bruit Palpable, without bruit  Aorta Not palpable N/A  Femoral Palpable Palpable  Popliteal Not palpable Not palpable  PT notPalpable  notPalpable  DP Palpable Palpable   Gastrointestinal: soft, NTND Musculoskeletal: M/S 5/5 throughout Extremities without ischemic changes   Neurologic: CN 2-12 intact Non-Invasive Vascular Imaging  AAA Duplex (11/17/2013) Previous size: 2.7 cm (Date: 11/27/2011)  Current size:  2.8 cm (Date: 11/17/2013) CTA thoracic aneurysm maximum diameter of 3.1 cm 11/27/2011 maximum diameter of 3.7 cm 11/17/2013   Medical Decision Making  The patient is a 73 y.o. female who presents with: asymptomatic AAA and thoracic anuerism with no increasing size after CTA of the chest and Aortic Duplex today.     At this time she will continue to go to the gym 3 times a week and maximum medical management to include BP control.  We will see her back in 2 years for a repeat CTA of the chest and Abdominal Aortic Duplex.  She was seen in clinic today By Dr. Diamond Nickel, Tajana Crotteau [facility] PA-C  [facility] [place] Office: [phone]   11/17/2013, 2:34 PM    I agree with the above.  I have seen and examined the patient.  CT scans were reviewed today.  The thoracic aorta measures 3.7 cm in maximum diameter.  No abdominal aortic aneurysm was seen.  Maximum diameter was 2.8 cm around the renal arteries.  I have recommended followup in 2 years with a repeat CT scan of the chest an ultrasound of the abdomen.  Durene Cal

## 2014-12-24 IMAGING — CT CT ANGIO CHEST
2 of 7 series · 14 of 31 positions shown · IV contrast (80CC OMNI 350)
Comparison: 11/27/2011.

CLINICAL DATA: Followup thoracoabdominal aneurysm.

EXAM:
CT ANGIOGRAPHY CHEST, ABDOMEN AND PELVIS
TECHNIQUE: Multidetector CT imaging through the chest, abdomen and pelvis was
performed using the standard protocol during bolus administration of
intravenous contrast. Multiplanar reconstructed images and MIPs were
obtained and reviewed to evaluate the vascular anatomy.
CONTRAST:  80mL OMNIPAQUE IOHEXOL 350 MG/ML SOLN

[Series 4: angio · axial · 0.74mm/px · z∈[-528,-38]mm · 12 of 229 slices shown]
[im 18/229  lung]
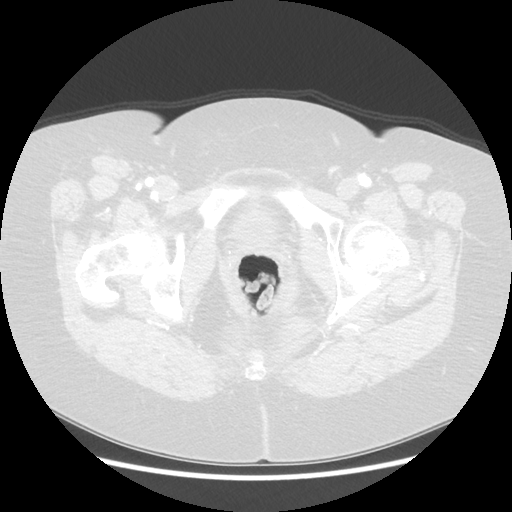
[im 36/229  mediastinal]
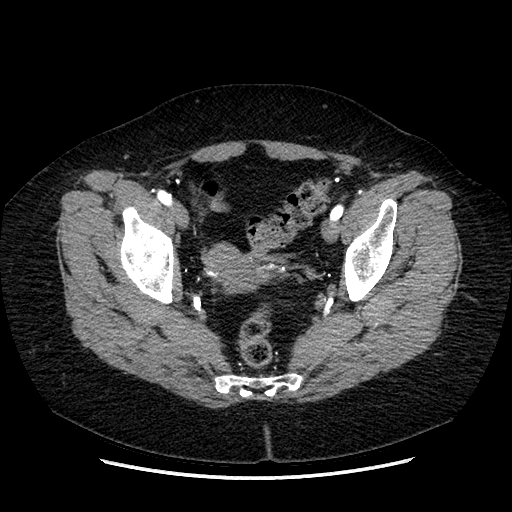
[im 53/229  lung]
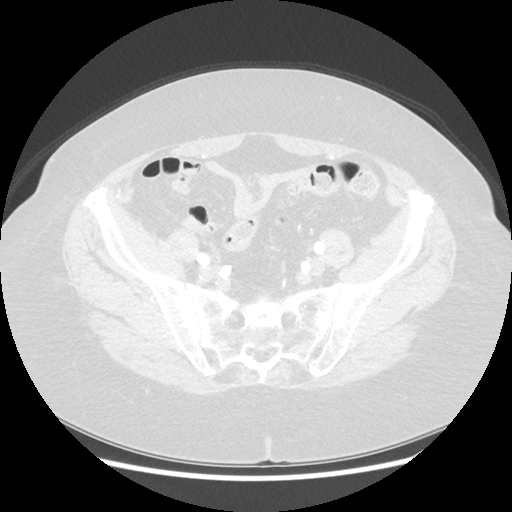
[im 71/229  mediastinal]
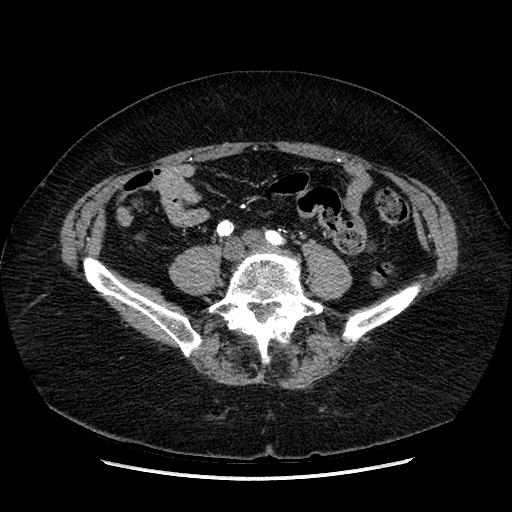
[im 88/229  lung]
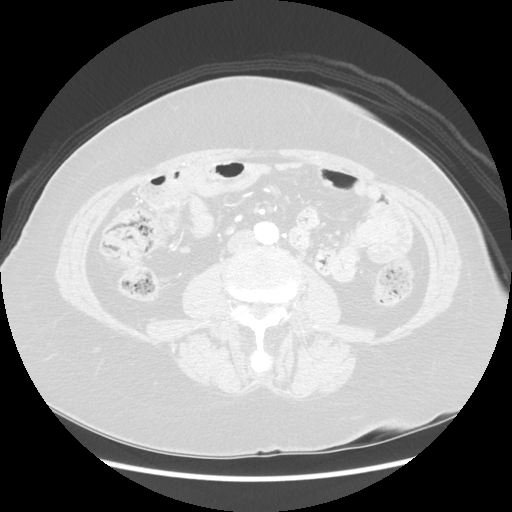
[im 106/229  mediastinal]
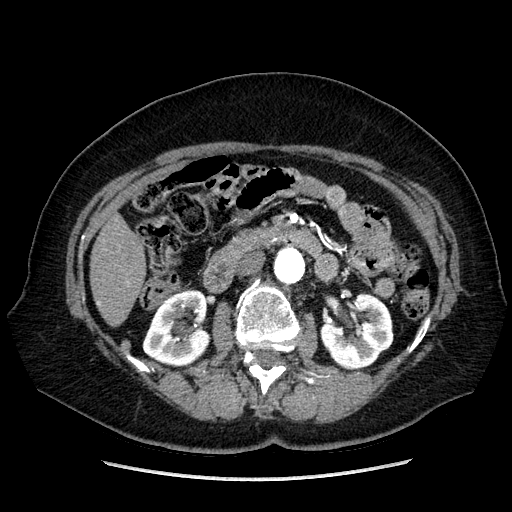
[im 123/229  lung]
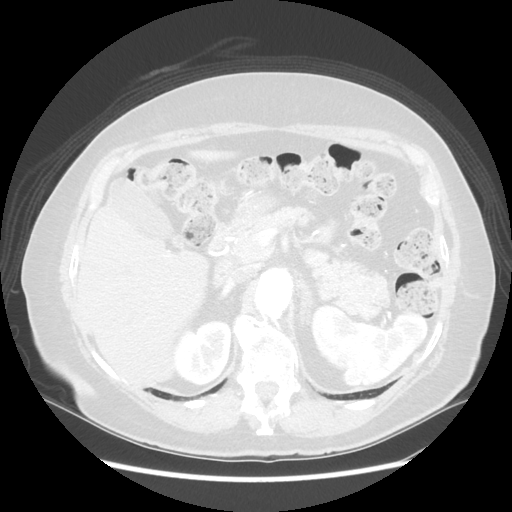
[im 141/229  mediastinal]
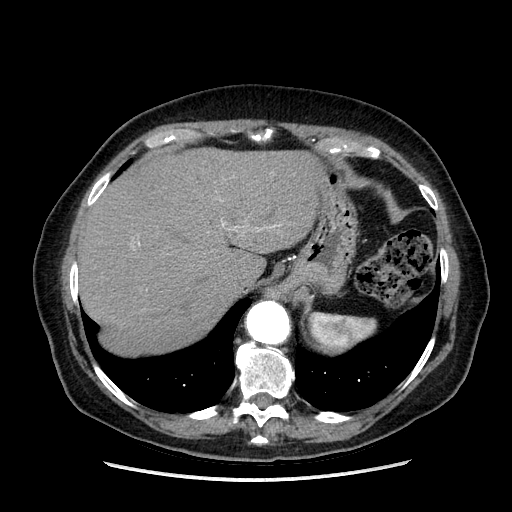
[im 158/229  lung]
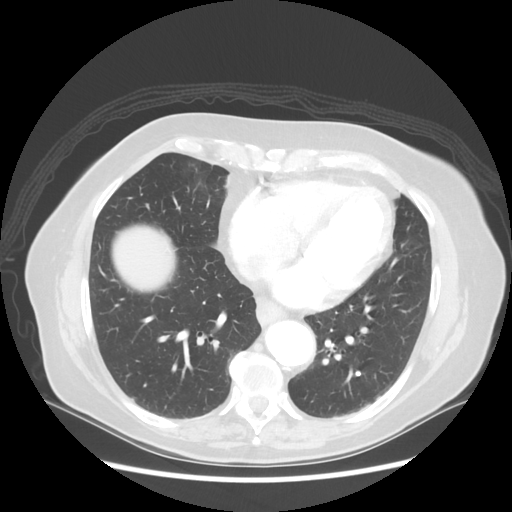
[im 176/229  mediastinal]
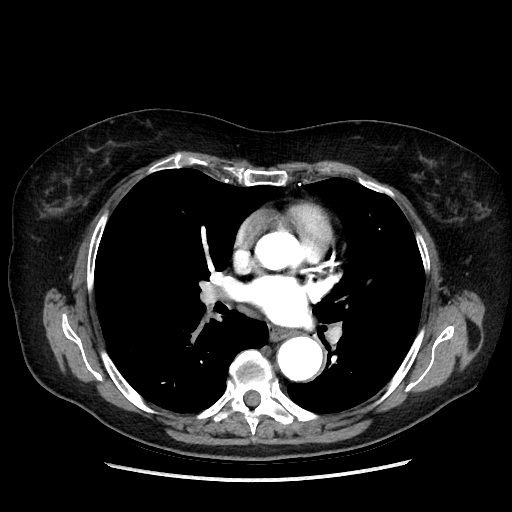
[im 193/229  lung]
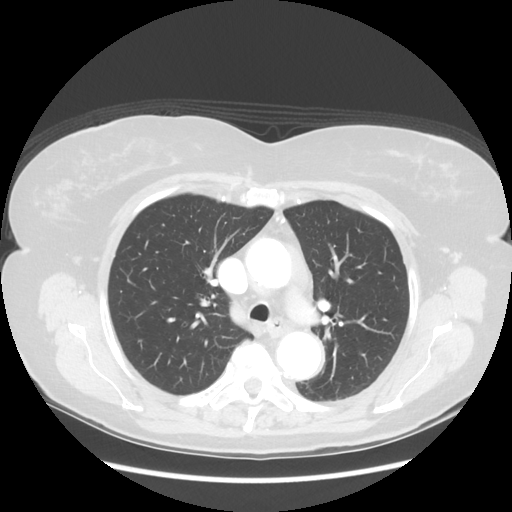
[im 211/229  mediastinal]
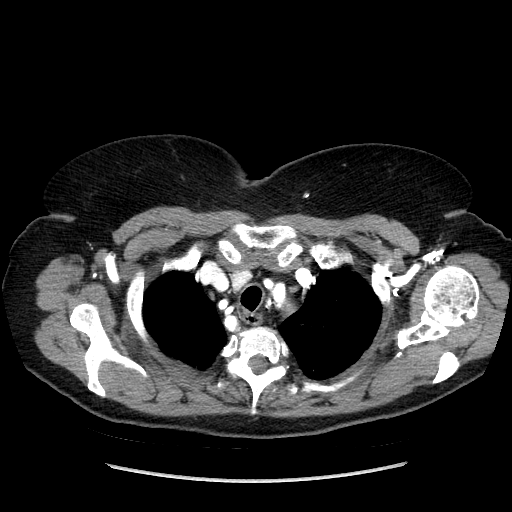

[Series 5: lung windows · axial · 0.74mm/px · z∈[-213,-103]mm · 2 of 66 slices shown]
[im 22/66  mediastinal]
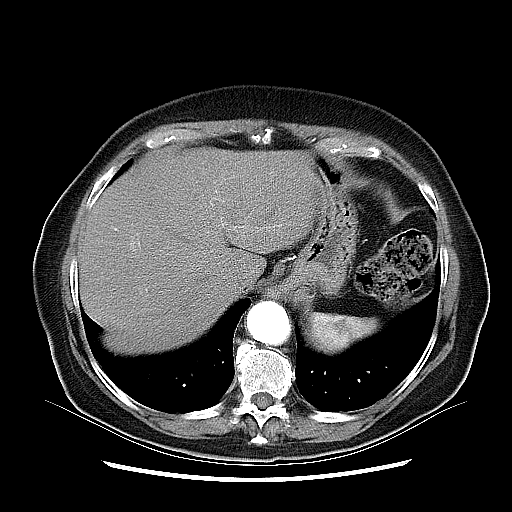
[im 44/66  mediastinal]
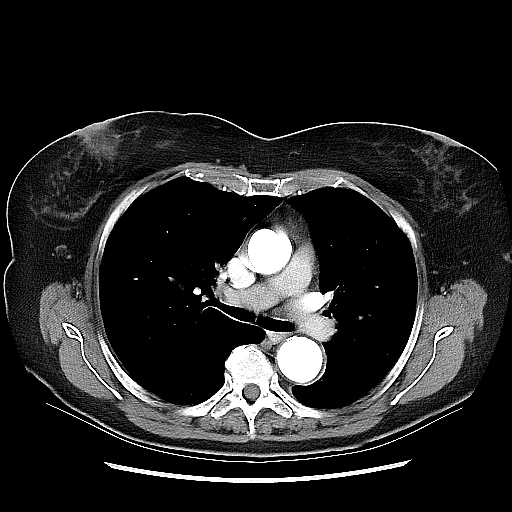

[14 of 31 positions shown; findings below may reference images not displayed]

FINDINGS: CTA CHEST FINDINGS

[DATE] descending thoracic aorta is dilated. Measures 3.6 cm x 3.5 cm
at the level of the left pulmonary artery, unchanged from the prior
study. There is no dissection. Mild atherosclerotic plaque, partly
calcified, extends from the aortic arch through the descending
thoracic aorta. There is an Jean Fabrice Pepertua right subclavian artery with
plaque at its origin by without a hemodynamically significant
stenosis. The innominate artery gives rise to the right common
carotid artery and right vertebral artery.

Heart is normal in size and configuration. There are moderate
coronary artery calcifications. No mediastinal or hilar masses or
pathologically enlarged lymph nodes. Small hiatal hernia.

Minimal subsegmental atelectasis and/ or scarring is noted in the
lung bases. Lungs are otherwise clear. No pleural effusion.

Review of the MIP images confirms the above findings.

CTA ABDOMEN AND PELVIS FINDINGS

Abdominal aorta measures 3.7 cm transverse x 3.5 cm AP at the aortic
hiatus. This decreases to 2.7 cm x 2.9 cm at the level of the renal
arteries to 1.7 cm x 1.7 cm just above the bifurcation. Partly
calcified atherosclerotic plaque extends along the length of the
abdominal aorta without significant stenosis. Partly calcified
plaque extends into the common iliac arteries also without
significant stenosis. The celiac axis, superior mesenteric artery
and single bilateral renal arteries are widely patent. The inferior
mesenteric artery is patent.

Liver, spleen, gallbladder, pancreas, adrenal glands:  Unremarkable.

2.7 cm left upper pole renal cyst. There are several renal sinus
cysts. Kidneys otherwise unremarkable. No hydronephrosis. Normal
ureters. Normal bladder.

Uterus and adnexa are unremarkable.

No adenopathy.  No abnormal fluid collections.

Unremarkable bowel.

MUSCULOSKELETAL: Compression fractures of T12 and L1, old and stable
from the prior study. No new fractures. Degenerative changes noted
throughout the spine. Grade 1 anterolisthesis of L4 on L5. Bones are
demineralized. No osteoblastic or osteolytic lesions.

Review of the MIP images confirms the above findings.
IMPRESSION: 1. Stable dilation of the thoracic aorta. This measures 3.7 cm x
cm in greatest transverse dimensions as it did on the prior study.
No dissection. No abdominal aortic aneurysm.
2. Mild atherosclerotic plaque.
3. Aberrant right subclavian artery.
4. Other chronic findings as described, stable from the prior study.

## 2016-01-19 ENCOUNTER — Other Ambulatory Visit: Payer: Self-pay | Admitting: *Deleted

## 2016-01-19 DIAGNOSIS — I714 Abdominal aortic aneurysm, without rupture, unspecified: Secondary | ICD-10-CM

## 2016-02-18 ENCOUNTER — Ambulatory Visit: Payer: Medicare Other | Admitting: Surgery

## 2016-04-17 ENCOUNTER — Other Ambulatory Visit: Payer: Medicare Other

## 2016-04-17 ENCOUNTER — Other Ambulatory Visit (HOSPITAL_COMMUNITY): Payer: Medicare Other

## 2016-04-17 ENCOUNTER — Ambulatory Visit: Payer: Medicare Other | Admitting: Surgery

## 2019-06-20 ENCOUNTER — Ambulatory Visit: Payer: Self-pay | Admitting: Orthopedic Surgery

## 2019-06-21 ENCOUNTER — Other Ambulatory Visit (HOSPITAL_COMMUNITY)
Admission: RE | Admit: 2019-06-21 | Discharge: 2019-06-21 | Disposition: A | Discharge status: Home / Self Care | Payer: Medicare Other | Source: Ambulatory Visit | Attending: Orthopedic Surgery | Admitting: Orthopedic Surgery

## 2019-06-21 DIAGNOSIS — Z01812 Encounter for preprocedural laboratory examination: Secondary | ICD-10-CM | POA: Diagnosis present

## 2019-06-21 DIAGNOSIS — Z20822 Contact with and (suspected) exposure to covid-19: Secondary | ICD-10-CM | POA: Diagnosis not present

## 2019-06-21 LAB — SARS CORONAVIRUS 2 (TAT 6-24 HRS): SARS Coronavirus 2: NEGATIVE

## 2019-06-23 ENCOUNTER — Ambulatory Visit: Payer: Self-pay | Admitting: Orthopedic Surgery

## 2019-06-23 NOTE — H&P (Signed)
TOTAL KNEE ADMISSION H&P  Patient is being admitted for left total knee arthroplasty.   Subjective:  Chief Complaint:left knee pain.  HPI: Tiffany Dunn, 79 y.o. female, has a history of pain and functional disability in the left knee due to arthritis and has failed non-surgical conservative treatments for greater than 12 weeks to includeNSAID's and/or analgesics, corticosteriod injections, flexibility and strengthening excercises, supervised PT with diminished ADL's post treatment, use of assistive devices, weight reduction as appropriate and activity modification.  Onset of symptoms was gradual, starting >10 years ago with rapidlly worsening course since that time. The patient noted no past surgery on the left knee(s).  Patient currently rates pain in the left knee(s) at 10 out of 10 with activity. Patient has night pain, worsening of pain with activity and weight bearing, pain that interferes with activities of daily living, pain with passive range of motion and joint swelling.  Patient has evidence of subchondral cysts, subchondral sclerosis, periarticular osteophytes and joint space narrowing by imaging studies. There is no active infection.  Patient Active Problem List   Diagnosis Date Noted  . Osteoarthritis of left knee 06/25/2019  . AAA (abdominal aortic aneurysm) (HCC) 11/27/2011  . 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 AAA (abdominal aortic aneurysm) (HCC)   . Arthritis   . Hypertension     Past Surgical History:  Procedure Laterality Date  . BACK SURGERY  2007  . JOINT REPLACEMENT  2006   Bilateral Knee  . KNEE SURGERY    . SPINE SURGERY  2007   Back    No current facility-administered medications for this visit.   No current outpatient medications on file.   Facility-Administered Medications Ordered in Other Visits  Medication Dose Route Frequency Provider Last Rate Last Admin  . 0.9 %  sodium chloride  infusion   Intravenous Continuous Airen Stiehl, Arlys John, MD      . acetaminophen (OFIRMEV) IV 1,000 mg  1,000 mg Intravenous To OR Finnbar Cedillos, Arlys John, MD      . ceFAZolin (ANCEF) IVPB 2g/100 mL premix  2 g Intravenous On Call to OR Samson Frederic, MD      . lactated ringers infusion   Intravenous Continuous Gaynelle Adu, MD 50 mL/hr at 06/25/19 0719 New Bag at 06/25/19 0719  . povidone-iodine 10 % swab 2 application  2 application Topical Once Sharifah Champine, Arlys John, MD      . tranexamic acid (CYKLOKAPRON) IVPB 1,000 mg  1,000 mg Intravenous To OR Samson Frederic, MD       Allergies  Allergen Reactions  . Ultram [Tramadol Hcl] Anaphylaxis and Nausea And Vomiting    Social History   Tobacco Use  . Smoking status: Never Smoker  . Smokeless tobacco: Never Used  Substance Use Topics  . Alcohol use: No    Family History  Problem Relation Age of Onset  . Heart disease Father        Before age 32  . Heart attack Father   . Hypertension Father   . Varicose Veins Mother   . Dementia Mother   . Stroke Mother   . Heart attack Mother   . Cancer Sister 12       brain tumor  . Diabetes Brother   . Hypertension Brother   . Stroke Brother   . Hypertension Brother   . Heart disease Brother   . Heart attack Brother      Review of Systems  Constitutional: Negative.  HENT: Negative.   Eyes: Negative.   Respiratory: Negative.   Cardiovascular: Negative.   Gastrointestinal: Negative.   Endocrine: Negative.   Genitourinary: Negative.   Musculoskeletal: Positive for arthralgias.  Allergic/Immunologic: Negative.   Neurological: Negative.   Hematological: Negative.   Psychiatric/Behavioral: Negative.     Objective:  Physical Exam  Vitals reviewed. Constitutional: She is oriented to person, place, and time. She appears well-developed and well-nourished.  HENT:  Head: Normocephalic and atraumatic.  Eyes: Pupils are equal, round, and reactive to light. Conjunctivae and EOM are normal.   Cardiovascular: Normal rate, regular rhythm and intact distal pulses.  Respiratory: Effort normal. No respiratory distress.  GI: Soft. She exhibits no distension.  Genitourinary:    Genitourinary Comments: deferred   Musculoskeletal:     Cervical back: Normal range of motion and neck supple.     Left knee: Swelling, effusion and bony tenderness present. Decreased range of motion. Tenderness present over the medial joint line and lateral joint line. Abnormal alignment.  Neurological: She is alert and oriented to person, place, and time. She has normal reflexes.  Skin: Skin is warm and dry.  Psychiatric: She has a normal mood and affect. Her behavior is normal. Judgment and thought content normal.    Vital signs in last 24 hours: @VSRANGES @  Labs:   Estimated body mass index is 31.94 kg/m as calculated from the following:   Height as of 06/24/19: 5\' 3"  (1.6 m).   Weight as of 06/24/19: 81.8 kg.   Imaging Review Plain radiographs demonstrate severe degenerative joint disease of the left knee(s). The overall alignment issignificant varus. The bone quality appears to be adequate for age and reported activity level.      Assessment/Plan:  End stage arthritis, left knee   The patient history, physical examination, clinical judgment of the provider and imaging studies are consistent with end stage degenerative joint disease of the left knee(s) and total knee arthroplasty is deemed medically necessary. The treatment options including medical management, injection therapy arthroscopy and arthroplasty were discussed at length. The risks and benefits of total knee arthroplasty were presented and reviewed. The risks due to aseptic loosening, infection, stiffness, patella tracking problems, thromboembolic complications and other imponderables were discussed. The patient acknowledged the explanation, agreed to proceed with the plan and consent was signed. Patient is being admitted for inpatient  treatment for surgery, pain control, PT, OT, prophylactic antibiotics, VTE prophylaxis, progressive ambulation and ADL's and discharge planning. The patient is planning to be discharged home with OPPT     Patient's anticipated LOS is less than 2 midnights, meeting these requirements: - Younger than 56 - Lives within 1 hour of care - Has a competent adult at home to recover with post-op recover - NO history of  - Chronic pain requiring opiods  - Diabetes  - Coronary Artery Disease  - Heart failure  - Heart attack  - Stroke  - DVT/VTE  - Cardiac arrhythmia  - Respiratory Failure/COPD  - Renal failure  - Anemia  - Advanced Liver disease

## 2019-06-23 NOTE — H&P (View-Only) (Signed)
TOTAL KNEE ADMISSION H&P  Patient is being admitted for left total knee arthroplasty.   Subjective:  Chief Complaint:left knee pain.  HPI: Tiffany Dunn, 79 y.o. female, has a history of pain and functional disability in the left knee due to arthritis and has failed non-surgical conservative treatments for greater than 12 weeks to includeNSAID's and/or analgesics, corticosteriod injections, flexibility and strengthening excercises, supervised PT with diminished ADL's post treatment, use of assistive devices, weight reduction as appropriate and activity modification.  Onset of symptoms was gradual, starting >10 years ago with rapidlly worsening course since that time. The patient noted no past surgery on the left knee(s).  Patient currently rates pain in the left knee(s) at 10 out of 10 with activity. Patient has night pain, worsening of pain with activity and weight bearing, pain that interferes with activities of daily living, pain with passive range of motion and joint swelling.  Patient has evidence of subchondral cysts, subchondral sclerosis, periarticular osteophytes and joint space narrowing by imaging studies. There is no active infection.  Patient Active Problem List   Diagnosis Date Noted  . Osteoarthritis of left knee 06/25/2019  . AAA (abdominal aortic aneurysm) (HCC) 11/27/2011  . 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 AAA (abdominal aortic aneurysm) (HCC)   . Arthritis   . Hypertension     Past Surgical History:  Procedure Laterality Date  . BACK SURGERY  2007  . JOINT REPLACEMENT  2006   Bilateral Knee  . KNEE SURGERY    . SPINE SURGERY  2007   Back    No current facility-administered medications for this visit.   No current outpatient medications on file.   Facility-Administered Medications Ordered in Other Visits  Medication Dose Route Frequency Provider Last Rate Last Admin  . 0.9 %  sodium chloride  infusion   Intravenous Continuous Vester Titsworth, Arlys John, MD      . acetaminophen (OFIRMEV) IV 1,000 mg  1,000 mg Intravenous To OR Travin Marik, Arlys John, MD      . ceFAZolin (ANCEF) IVPB 2g/100 mL premix  2 g Intravenous On Call to OR Samson Frederic, MD      . lactated ringers infusion   Intravenous Continuous Gaynelle Adu, MD 50 mL/hr at 06/25/19 0719 New Bag at 06/25/19 0719  . povidone-iodine 10 % swab 2 application  2 application Topical Once Janith Nielson, Arlys John, MD      . tranexamic acid (CYKLOKAPRON) IVPB 1,000 mg  1,000 mg Intravenous To OR Samson Frederic, MD       Allergies  Allergen Reactions  . Ultram [Tramadol Hcl] Anaphylaxis and Nausea And Vomiting    Social History   Tobacco Use  . Smoking status: Never Smoker  . Smokeless tobacco: Never Used  Substance Use Topics  . Alcohol use: No    Family History  Problem Relation Age of Onset  . Heart disease Father        Before age 32  . Heart attack Father   . Hypertension Father   . Varicose Veins Mother   . Dementia Mother   . Stroke Mother   . Heart attack Mother   . Cancer Sister 12       brain tumor  . Diabetes Brother   . Hypertension Brother   . Stroke Brother   . Hypertension Brother   . Heart disease Brother   . Heart attack Brother      Review of Systems  Constitutional: Negative.  HENT: Negative.   Eyes: Negative.   Respiratory: Negative.   Cardiovascular: Negative.   Gastrointestinal: Negative.   Endocrine: Negative.   Genitourinary: Negative.   Musculoskeletal: Positive for arthralgias.  Allergic/Immunologic: Negative.   Neurological: Negative.   Hematological: Negative.   Psychiatric/Behavioral: Negative.     Objective:  Physical Exam  Vitals reviewed. Constitutional: She is oriented to person, place, and time. She appears well-developed and well-nourished.  HENT:  Head: Normocephalic and atraumatic.  Eyes: Pupils are equal, round, and reactive to light. Conjunctivae and EOM are normal.   Cardiovascular: Normal rate, regular rhythm and intact distal pulses.  Respiratory: Effort normal. No respiratory distress.  GI: Soft. She exhibits no distension.  Genitourinary:    Genitourinary Comments: deferred   Musculoskeletal:     Cervical back: Normal range of motion and neck supple.     Left knee: Swelling, effusion and bony tenderness present. Decreased range of motion. Tenderness present over the medial joint line and lateral joint line. Abnormal alignment.  Neurological: She is alert and oriented to person, place, and time. She has normal reflexes.  Skin: Skin is warm and dry.  Psychiatric: She has a normal mood and affect. Her behavior is normal. Judgment and thought content normal.    Vital signs in last 24 hours: @VSRANGES@  Labs:   Estimated body mass index is 31.94 kg/m as calculated from the following:   Height as of 06/24/19: 5' 3" (1.6 m).   Weight as of 06/24/19: 81.8 kg.   Imaging Review Plain radiographs demonstrate severe degenerative joint disease of the left knee(s). The overall alignment issignificant varus. The bone quality appears to be adequate for age and reported activity level.      Assessment/Plan:  End stage arthritis, left knee   The patient history, physical examination, clinical judgment of the provider and imaging studies are consistent with end stage degenerative joint disease of the left knee(s) and total knee arthroplasty is deemed medically necessary. The treatment options including medical management, injection therapy arthroscopy and arthroplasty were discussed at length. The risks and benefits of total knee arthroplasty were presented and reviewed. The risks due to aseptic loosening, infection, stiffness, patella tracking problems, thromboembolic complications and other imponderables were discussed. The patient acknowledged the explanation, agreed to proceed with the plan and consent was signed. Patient is being admitted for inpatient  treatment for surgery, pain control, PT, OT, prophylactic antibiotics, VTE prophylaxis, progressive ambulation and ADL's and discharge planning. The patient is planning to be discharged home with OPPT     Patient's anticipated LOS is less than 2 midnights, meeting these requirements: - Younger than 65 - Lives within 1 hour of care - Has a competent adult at home to recover with post-op recover - NO history of  - Chronic pain requiring opiods  - Diabetes  - Coronary Artery Disease  - Heart failure  - Heart attack  - Stroke  - DVT/VTE  - Cardiac arrhythmia  - Respiratory Failure/COPD  - Renal failure  - Anemia  - Advanced Liver disease     

## 2019-06-23 NOTE — Patient Instructions (Signed)
DUE TO COVID-19 ONLY ONE VISITOR IS ALLOWED TO COME WITH YOU AND STAY IN THE WAITING ROOM ONLY DURING PRE OP AND PROCEDURE DAY OF SURGERY. THE 1 VISITOR MAY VISIT WITH YOU AFTER SURGERY IN YOUR PRIVATE ROOM DURING VISITING HOURS ONLY!  YOU NEED TO HAVE A COVID 19 TEST ON: 06/21/19. THIS TEST MUST BE DONE BEFORE SURGERY, COME  [address], [place] , [address].  [facility]) ONCE YOUR COVID TEST IS COMPLETED, PLEASE BEGIN THE QUARANTINE INSTRUCTIONS AS OUTLINED IN YOUR HANDOUT.                Doreatha Massed   Your procedure is scheduled on: 06/25/19   Report to [facility] Main  Entrance   Report to admitting at: 6:00 AM     Call this number if you have problems the morning of surgery [phone]    Remember:    BRUSH YOUR TEETH MORNING OF SURGERY AND RINSE YOUR MOUTH OUT, NO CHEWING GUM CANDY OR MINTS.     Take these medicines the morning of surgery with A SIP OF WATER: metoprolol.                                 You may not have any metal on your body including hair pins and              piercings  Do not wear jewelry, make-up, lotions, powders or perfumes, deodorant             Do not wear nail polish on your fingernails.  Do not shave  48 hours prior to surgery.             Do not bring valuables to the hospital. [facility] IS NOT             RESPONSIBLE   FOR VALUABLES.  Contacts, dentures or bridgework may not be worn into surgery.  Leave suitcase in the car. After surgery it may be brought to your room.     Patients discharged the day of surgery will not be allowed to drive home. IF YOU ARE HAVING SURGERY AND GOING HOME THE SAME DAY, YOU MUST HAVE AN ADULT TO DRIVE YOU HOME AND BE WITH YOU FOR 24 HOURS. YOU MAY GO HOME BY TAXI OR UBER OR ORTHERWISE, BUT AN ADULT MUST ACCOMPANY YOU HOME AND STAY WITH YOU FOR 24 HOURS.  Name and phone number of your driver:  Special Instructions: N/A              Please read over the following fact sheets you  were given: _____________________________________________________________________             NO SOLID FOOD AFTER MIDNIGHT THE NIGHT PRIOR TO SURGERY. NOTHING BY MOUTH EXCEPT CLEAR LIQUIDS UNTIL: 5:30 am . PLEASE FINISH ENSURE DRINK PER SURGEON ORDER  WHICH NEEDS TO BE COMPLETED AT: 5:30 am .   CLEAR LIQUID DIET   Foods Allowed                                                                     Foods Excluded  Coffee and tea, regular and decaf  liquids that you cannot  Plain Jell-O any favor except red or purple                                           see through such as: Fruit ices (not with fruit pulp)                                     milk, soups, orange juice  Iced Popsicles                                    All solid food Carbonated beverages, regular and diet                                    Cranberry, grape and apple juices Sports drinks like Gatorade Lightly seasoned clear broth or consume(fat free) Sugar, honey syrup  Sample Menu Breakfast                                Lunch                                     Supper Cranberry juice                    Beef broth                            Chicken broth Jell-O                                     Grape juice                           Apple juice Coffee or tea                        Jell-O                                      Popsicle                                                Coffee or tea                        Coffee or tea  _____________________________________________________________________  [facility] - Preparing for Surgery Before surgery, you can play an important role.  Because skin is not sterile, your skin needs to be as free of germs as possible.  You can reduce the number of germs on your skin by washing with CHG (chlorahexidine gluconate) soap before surgery.  CHG is an antiseptic cleaner which kills  germs and bonds with the skin to continue killing germs even after  washing. Please DO NOT use if you have an allergy to CHG or antibacterial soaps.  If your skin becomes reddened/irritated stop using the CHG and inform your nurse when you arrive at Short Stay. Do not shave (including legs and underarms) for at least 48 hours prior to the first CHG shower.  You may shave your face/neck. Please follow these instructions carefully:  1.  Shower with CHG Soap the night before surgery and the  morning of Surgery.  2.  If you choose to wash your hair, wash your hair first as usual with your  normal  shampoo.  3.  After you shampoo, rinse your hair and body thoroughly to remove the  shampoo.                           4.  Use CHG as you would any other liquid soap.  You can apply chg directly  to the skin and wash                       Gently with a scrungie or clean washcloth.  5.  Apply the CHG Soap to your body ONLY FROM THE NECK DOWN.   Do not use on face/ open                           Wound or open sores. Avoid contact with eyes, ears mouth and genitals (private parts).                       Wash face,  Genitals (private parts) with your normal soap.             6.  Wash thoroughly, paying special attention to the area where your surgery  will be performed.  7.  Thoroughly rinse your body with warm water from the neck down.  8.  DO NOT shower/wash with your normal soap after using and rinsing off  the CHG Soap.                9.  Pat yourself dry with a clean towel.            10.  Wear clean pajamas.            11.  Place clean sheets on your bed the night of your first shower and do not  sleep with pets. Day of Surgery : Do not apply any lotions/deodorants the morning of surgery.  Please wear clean clothes to the hospital/surgery center.  FAILURE TO FOLLOW THESE INSTRUCTIONS MAY RESULT IN THE CANCELLATION OF YOUR SURGERY PATIENT SIGNATURE_________________________________  NURSE  SIGNATURE__________________________________  ________________________________________________________________________   Adam Phenix  An incentive spirometer is a tool that can help keep your lungs clear and active. This tool measures how well you are filling your lungs with each breath. Taking long deep breaths may help reverse or decrease the chance of developing breathing (pulmonary) problems (especially infection) following:  A long period of time when you are unable to move or be active. BEFORE THE PROCEDURE   If the spirometer includes an indicator to show your best effort, your nurse or respiratory therapist will set it to a desired goal.  If possible, sit up straight or lean slightly forward. Try not to slouch.  Hold the incentive spirometer in an  upright position. INSTRUCTIONS FOR USE  1. Sit on the edge of your bed if possible, or sit up as far as you can in bed or on a chair. 2. Hold the incentive spirometer in an upright position. 3. Breathe out normally. 4. Place the mouthpiece in your mouth and seal your lips tightly around it. 5. Breathe in slowly and as deeply as possible, raising the piston or the ball toward the top of the column. 6. Hold your breath for 3-5 seconds or for as long as possible. Allow the piston or ball to fall to the bottom of the column. 7. Remove the mouthpiece from your mouth and breathe out normally. 8. Rest for a few seconds and repeat Steps 1 through 7 at least 10 times every 1-2 hours when you are awake. Take your time and take a few normal breaths between deep breaths. 9. The spirometer may include an indicator to show your best effort. Use the indicator as a goal to work toward during each repetition. 10. After each set of 10 deep breaths, practice coughing to be sure your lungs are clear. If you have an incision (the cut made at the time of surgery), support your incision when coughing by placing a pillow or rolled up towels firmly  against it. Once you are able to get out of bed, walk around indoors and cough well. You may stop using the incentive spirometer when instructed by your caregiver.  RISKS AND COMPLICATIONS  Take your time so you do not get dizzy or light-headed.  If you are in pain, you may need to take or ask for pain medication before doing incentive spirometry. It is harder to take a deep breath if you are having pain. AFTER USE  Rest and breathe slowly and easily.  It can be helpful to keep track of a log of your progress. Your caregiver can provide you with a simple table to help with this. If you are using the spirometer at home, follow these instructions: [place] IF:   You are having difficultly using the spirometer.  You have trouble using the spirometer as often as instructed.  Your pain medication is not giving enough relief while using the spirometer.  You develop fever of 100.5 F (38.1 C) or higher. SEEK IMMEDIATE MEDICAL CARE IF:   You cough up bloody sputum that had not been present before.  You develop fever of 102 F (38.9 C) or greater.  You develop worsening pain at or near the incision site. MAKE SURE YOU:   Understand these instructions.  Will watch your condition.  Will get help right away if you are not doing well or get worse. Document Released: 07/03/2006 Document Revised: 05/15/2011 Document Reviewed: 09/03/2006 [facility] Patient Information 2014 [place], [place].   ________________________________________________________________________

## 2019-06-24 ENCOUNTER — Encounter (HOSPITAL_COMMUNITY)
Admission: RE | Admit: 2019-06-24 | Discharge: 2019-06-24 | Disposition: A | Discharge status: Home / Self Care | Payer: Medicare Other | Source: Ambulatory Visit | Attending: Orthopedic Surgery | Admitting: Orthopedic Surgery

## 2019-06-24 ENCOUNTER — Other Ambulatory Visit: Payer: Self-pay

## 2019-06-24 ENCOUNTER — Encounter (HOSPITAL_COMMUNITY): Payer: Self-pay

## 2019-06-24 DIAGNOSIS — Z01812 Encounter for preprocedural laboratory examination: Secondary | ICD-10-CM | POA: Insufficient documentation

## 2019-06-24 LAB — CBC
HCT: 45 % (ref 36.0–46.0)
Hemoglobin: 13.7 g/dL (ref 12.0–15.0)
MCH: 26.8 pg (ref 26.0–34.0)
MCHC: 30.4 g/dL (ref 30.0–36.0)
MCV: 87.9 fL (ref 80.0–100.0)
Platelets: 198 10*3/uL (ref 150–400)
RBC: 5.12 MIL/uL — ABNORMAL HIGH (ref 3.87–5.11)
RDW: 15.6 % — ABNORMAL HIGH (ref 11.5–15.5)
WBC: 6.7 10*3/uL (ref 4.0–10.5)
nRBC: 0 % (ref 0.0–0.2)

## 2019-06-24 LAB — URINALYSIS, MICROSCOPIC (REFLEX)

## 2019-06-24 LAB — COMPREHENSIVE METABOLIC PANEL
ALT: 20 U/L (ref 0–44)
AST: 20 U/L (ref 15–41)
Albumin: 4.2 g/dL (ref 3.5–5.0)
Alkaline Phosphatase: 52 U/L (ref 38–126)
Anion gap: 7 (ref 5–15)
BUN: 18 mg/dL (ref 8–23)
CO2: 30 mmol/L (ref 22–32)
Calcium: 9.6 mg/dL (ref 8.9–10.3)
Chloride: 107 mmol/L (ref 98–111)
Creatinine, Ser: 0.78 mg/dL (ref 0.44–1.00)
GFR calc Af Amer: >60 mL/min (ref >60)
GFR calc non Af Amer: >60 mL/min (ref >60)
Glucose, Bld: 101 mg/dL — ABNORMAL HIGH (ref 70–99)
Potassium: 5.2 mmol/L — ABNORMAL HIGH (ref 3.5–5.1)
Sodium: 144 mmol/L (ref 135–145)
Total Bilirubin: 0.9 mg/dL (ref 0.3–1.2)
Total Protein: 6.9 g/dL (ref 6.5–8.1)

## 2019-06-24 LAB — URINALYSIS, ROUTINE W REFLEX MICROSCOPIC
Bilirubin Urine: NEGATIVE
Glucose, UA: NEGATIVE mg/dL
Ketones, ur: NEGATIVE mg/dL
Nitrite: NEGATIVE
Protein, ur: NEGATIVE mg/dL
Specific Gravity, Urine: 1.02 (ref 1.005–1.030)
pH: 7 (ref 5.0–8.0)

## 2019-06-24 LAB — PROTIME-INR
INR: 1 (ref 0.8–1.2)
Prothrombin Time: 13.2 seconds (ref 11.4–15.2)

## 2019-06-24 LAB — SURGICAL PCR SCREEN
MRSA, PCR: NEGATIVE
Staphylococcus aureus: NEGATIVE

## 2019-06-24 NOTE — Anesthesia Preprocedure Evaluation (Addendum)
Anesthesia Evaluation  Patient identified by MRN, date of birth, ID band Patient awake    Reviewed: Allergy & Precautions, NPO status , Patient's Chart, lab work & pertinent test results, reviewed documented beta blocker date and time   History of Anesthesia Complications Negative for: history of anesthetic complications  Airway Mallampati: II  TM Distance: >3 FB Neck ROM: Full    Dental  (+) Dental Advisory Given, Teeth Intact   Pulmonary neg pulmonary ROS,    Pulmonary exam normal        Cardiovascular hypertension, Pt. on home beta blockers and Pt. on medications Normal cardiovascular exam   '19 CT Angio (Care everywhere) - Mild aneurysmal dilatation of the descending thoracic aorta measuring up to 35 mm in diameter. (stable size since imaging in 2015)    Neuro/Psych negative neurological ROS  negative psych ROS   GI/Hepatic negative GI ROS, Neg liver ROS,   Endo/Other   Obesity   Renal/GU negative Renal ROS     Musculoskeletal  (+) Arthritis ,   Abdominal   Peds  Hematology  Plt 198k    Anesthesia Other Findings Covid neg 4/17  Reproductive/Obstetrics                           Anesthesia Physical Anesthesia Plan  ASA: II  Anesthesia Plan: Spinal   Post-op Pain Management:  Regional for Post-op pain   Induction:   PONV Risk Score and Plan: 2 and Treatment may vary due to age or medical condition and Propofol infusion  Airway Management Planned: Natural Airway and Simple Face Mask  Additional Equipment: None  Intra-op Plan:   Post-operative Plan:   Informed Consent: I have reviewed the patients History and Physical, chart, labs and discussed the procedure including the risks, benefits and alternatives for the proposed anesthesia with the patient or authorized representative who has indicated his/her understanding and acceptance.       Plan Discussed with: CRNA and  Anesthesiologist  Anesthesia Plan Comments: (Labs reviewed, platelets acceptable. Discussed risks and benefits of spinal, including spinal/epidural hematoma, infection, failed block, and PDPH. Patient expressed understanding and wished to proceed. )       Anesthesia Quick Evaluation

## 2019-06-24 NOTE — Progress Notes (Signed)
PCP - Dr. Johnney Killian A. :Clearance: 06/05/19. Epic. Cardiologist -   Chest x-ray -  EKG - 06/17/19. CEW Stress Test -  ECHO -  Cardiac Cath -   Sleep Study -  CPAP -   Fasting Blood Sugar -  Checks Blood Sugar _____ times a day  Blood Thinner Instructions: Aspirin Instructions: Last Dose:06/19/19  Anesthesia review: Hx. AAA,HTN.  Patient denies shortness of breath, fever, cough and chest pain at PAT appointment   Patient verbalized understanding of instructions that were given to them at the PAT appointment. Patient was also instructed that they will need to review over the PAT instructions again at home before surgery.

## 2019-06-25 ENCOUNTER — Observation Stay (HOSPITAL_COMMUNITY)
Admission: RE | Admit: 2019-06-25 | Discharge: 2019-06-26 | Disposition: A | Discharge status: Home / Self Care | Payer: Medicare Other | Source: Ambulatory Visit | Attending: Orthopedic Surgery | Admitting: Orthopedic Surgery

## 2019-06-25 ENCOUNTER — Observation Stay (HOSPITAL_COMMUNITY): Payer: Medicare Other

## 2019-06-25 ENCOUNTER — Ambulatory Visit (HOSPITAL_COMMUNITY): Payer: Medicare Other | Admitting: Anesthesiology

## 2019-06-25 ENCOUNTER — Ambulatory Visit (HOSPITAL_COMMUNITY): Payer: Medicare Other | Admitting: Physician Assistant

## 2019-06-25 ENCOUNTER — Encounter (HOSPITAL_COMMUNITY): Payer: Self-pay | Admitting: Orthopedic Surgery

## 2019-06-25 ENCOUNTER — Encounter (HOSPITAL_COMMUNITY)
Admission: RE | Disposition: A | Discharge status: Home / Self Care | Payer: Self-pay | Source: Ambulatory Visit | Attending: Orthopedic Surgery

## 2019-06-25 DIAGNOSIS — Z6832 Body mass index (BMI) 32.0-32.9, adult: Secondary | ICD-10-CM | POA: Diagnosis not present

## 2019-06-25 DIAGNOSIS — I712 Thoracic aortic aneurysm, without rupture: Secondary | ICD-10-CM | POA: Insufficient documentation

## 2019-06-25 DIAGNOSIS — M1712 Unilateral primary osteoarthritis, left knee: Principal | ICD-10-CM | POA: Insufficient documentation

## 2019-06-25 DIAGNOSIS — Z96653 Presence of artificial knee joint, bilateral: Secondary | ICD-10-CM | POA: Diagnosis not present

## 2019-06-25 DIAGNOSIS — E669 Obesity, unspecified: Secondary | ICD-10-CM | POA: Diagnosis not present

## 2019-06-25 DIAGNOSIS — I714 Abdominal aortic aneurysm, without rupture: Secondary | ICD-10-CM | POA: Insufficient documentation

## 2019-06-25 DIAGNOSIS — I10 Essential (primary) hypertension: Secondary | ICD-10-CM | POA: Diagnosis not present

## 2019-06-25 DIAGNOSIS — Z96652 Presence of left artificial knee joint: Secondary | ICD-10-CM

## 2019-06-25 HISTORY — PX: KNEE ARTHROPLASTY: SHX992

## 2019-06-25 LAB — TYPE AND SCREEN
ABO/RH(D): A POS
Antibody Screen: NEGATIVE

## 2019-06-25 LAB — ABO/RH: ABO/RH(D): A POS

## 2019-06-25 SURGERY — ARTHROPLASTY, KNEE, TOTAL, USING IMAGELESS COMPUTER-ASSISTED NAVIGATION
Anesthesia: Spinal | Site: Knee | Laterality: Left

## 2019-06-25 MED ORDER — ALUM & MAG HYDROXIDE-SIMETH 200-200-20 MG/5ML PO SUSP: 30.0000 mL | ORAL | Status: DC | PRN

## 2019-06-25 MED ORDER — CEFAZOLIN SODIUM-DEXTROSE 2-4 GM/100ML-% IV SOLN
2.0000 g | INTRAVENOUS | Status: AC
  Administered 2019-06-25: 08:00:00 2 g via INTRAVENOUS
  Filled 2019-06-25: qty 100

## 2019-06-25 MED ORDER — PHENOL 1.4 % MT LIQD: 1.0000 | OROMUCOSAL | Status: DC | PRN

## 2019-06-25 MED ORDER — PROPOFOL 500 MG/50ML IV EMUL
INTRAVENOUS | Status: DC | PRN
  Administered 2019-06-25: 70 ug/kg/min via INTRAVENOUS

## 2019-06-25 MED ORDER — PHENYLEPHRINE HCL-NACL 10-0.9 MG/250ML-% IV SOLN
INTRAVENOUS | Status: DC | PRN
  Administered 2019-06-25: 30 ug/min via INTRAVENOUS

## 2019-06-25 MED ORDER — FENTANYL CITRATE (PF) 100 MCG/2ML IJ SOLN
INTRAMUSCULAR | Status: AC
  Filled 2019-06-25: qty 2

## 2019-06-25 MED ORDER — ONDANSETRON HCL 4 MG/2ML IJ SOLN: 4.0000 mg | Freq: Once | INTRAMUSCULAR | Status: DC | PRN

## 2019-06-25 MED ORDER — CEFAZOLIN SODIUM-DEXTROSE 2-4 GM/100ML-% IV SOLN
2.0000 g | Freq: Four times a day (QID) | INTRAVENOUS | Status: AC
  Administered 2019-06-25 (×2): 2 g via INTRAVENOUS
  Filled 2019-06-25 (×2): qty 100

## 2019-06-25 MED ORDER — EPHEDRINE SULFATE-NACL 50-0.9 MG/10ML-% IV SOSY
PREFILLED_SYRINGE | INTRAVENOUS | Status: DC | PRN
  Administered 2019-06-25: 10 mg via INTRAVENOUS

## 2019-06-25 MED ORDER — METHOCARBAMOL 500 MG IVPB - SIMPLE MED
500.0000 mg | Freq: Four times a day (QID) | INTRAVENOUS | Status: DC | PRN
  Filled 2019-06-25: qty 50

## 2019-06-25 MED ORDER — DIPHENHYDRAMINE HCL 12.5 MG/5ML PO ELIX: 12.5000 mg | ORAL_SOLUTION | ORAL | Status: DC | PRN

## 2019-06-25 MED ORDER — ISOPROPYL ALCOHOL 70 % SOLN
Status: DC | PRN
  Administered 2019-06-25: 1 via TOPICAL

## 2019-06-25 MED ORDER — DEXAMETHASONE SODIUM PHOSPHATE 10 MG/ML IJ SOLN
INTRAMUSCULAR | Status: AC
  Filled 2019-06-25: qty 1

## 2019-06-25 MED ORDER — SODIUM CHLORIDE 0.9% IV SOLUTION
INTRAVENOUS | Status: AC | PRN
  Administered 2019-06-25: 3000 mL via INTRAMUSCULAR

## 2019-06-25 MED ORDER — SODIUM CHLORIDE 0.9 % IV SOLN: INTRAVENOUS | Status: DC

## 2019-06-25 MED ORDER — ONDANSETRON HCL 4 MG PO TABS: 4.0000 mg | ORAL_TABLET | Freq: Four times a day (QID) | ORAL | Status: DC | PRN

## 2019-06-25 MED ORDER — PROPOFOL 1000 MG/100ML IV EMUL
INTRAVENOUS | Status: AC
  Filled 2019-06-25: qty 200

## 2019-06-25 MED ORDER — HYDROCODONE-ACETAMINOPHEN 7.5-325 MG PO TABS: 1.0000 | ORAL_TABLET | ORAL | Status: DC | PRN

## 2019-06-25 MED ORDER — KETOROLAC TROMETHAMINE 30 MG/ML IJ SOLN
INTRAMUSCULAR | Status: DC | PRN
  Administered 2019-06-25: 30 mg via INTRAVENOUS

## 2019-06-25 MED ORDER — BUPIVACAINE IN DEXTROSE 0.75-8.25 % IT SOLN
INTRATHECAL | Status: DC | PRN
  Administered 2019-06-25: 1.6 mL via INTRATHECAL

## 2019-06-25 MED ORDER — METOCLOPRAMIDE HCL 5 MG PO TABS: 5.0000 mg | ORAL_TABLET | Freq: Three times a day (TID) | ORAL | Status: DC | PRN

## 2019-06-25 MED ORDER — ACETAMINOPHEN 10 MG/ML IV SOLN
1000.0000 mg | INTRAVENOUS | Status: AC
  Administered 2019-06-25: 09:00:00 1000 mg via INTRAVENOUS
  Filled 2019-06-25: qty 100

## 2019-06-25 MED ORDER — SODIUM CHLORIDE (PF) 0.9 % IJ SOLN
INTRAMUSCULAR | Status: DC | PRN
  Administered 2019-06-25: 1000 mL via INTRAVENOUS

## 2019-06-25 MED ORDER — SODIUM CHLORIDE 0.9% FLUSH
INTRAVENOUS | Status: DC | PRN
  Administered 2019-06-25: 30 mL via INTRAVENOUS

## 2019-06-25 MED ORDER — VITAMIN D 25 MCG (1000 UNIT) PO TABS
1000.0000 [IU] | ORAL_TABLET | Freq: Every day | ORAL | Status: DC
  Administered 2019-06-26: 1000 [IU] via ORAL
  Filled 2019-06-25: qty 1

## 2019-06-25 MED ORDER — POVIDONE-IODINE 10 % EX SWAB: 2.0000 "application " | Freq: Once | CUTANEOUS | Status: DC

## 2019-06-25 MED ORDER — FENTANYL CITRATE (PF) 100 MCG/2ML IJ SOLN
INTRAMUSCULAR | Status: DC | PRN
  Administered 2019-06-25: 50 ug via INTRAVENOUS

## 2019-06-25 MED ORDER — SODIUM CHLORIDE 0.9 % IR SOLN
Status: DC | PRN
  Administered 2019-06-25: 1000 mL

## 2019-06-25 MED ORDER — ROSUVASTATIN CALCIUM 20 MG PO TABS
20.0000 mg | ORAL_TABLET | Freq: Every evening | ORAL | Status: DC
  Administered 2019-06-25: 20 mg via ORAL
  Filled 2019-06-25: qty 1

## 2019-06-25 MED ORDER — BUPIVACAINE HCL 0.25 % IJ SOLN
INTRAMUSCULAR | Status: AC
  Filled 2019-06-25: qty 1

## 2019-06-25 MED ORDER — MIDAZOLAM HCL 5 MG/5ML IJ SOLN
INTRAMUSCULAR | Status: DC | PRN
  Administered 2019-06-25: 1 mg via INTRAVENOUS

## 2019-06-25 MED ORDER — METHOCARBAMOL 500 MG PO TABS
500.0000 mg | ORAL_TABLET | Freq: Four times a day (QID) | ORAL | Status: DC | PRN
  Administered 2019-06-25 – 2019-06-26 (×2): 500 mg via ORAL
  Filled 2019-06-25 (×2): qty 1

## 2019-06-25 MED ORDER — METOPROLOL SUCCINATE ER 50 MG PO TB24
50.0000 mg | ORAL_TABLET | Freq: Every day | ORAL | Status: DC
  Filled 2019-06-25: qty 1

## 2019-06-25 MED ORDER — FENTANYL CITRATE (PF) 100 MCG/2ML IJ SOLN: 25.0000 ug | INTRAMUSCULAR | Status: DC | PRN

## 2019-06-25 MED ORDER — METOCLOPRAMIDE HCL 5 MG/ML IJ SOLN: 5.0000 mg | Freq: Three times a day (TID) | INTRAMUSCULAR | Status: DC | PRN

## 2019-06-25 MED ORDER — ONDANSETRON HCL 4 MG/2ML IJ SOLN: 4.0000 mg | Freq: Four times a day (QID) | INTRAMUSCULAR | Status: DC | PRN

## 2019-06-25 MED ORDER — KETOROLAC TROMETHAMINE 30 MG/ML IJ SOLN
INTRAMUSCULAR | Status: AC
  Filled 2019-06-25: qty 1

## 2019-06-25 MED ORDER — DEXAMETHASONE SODIUM PHOSPHATE 10 MG/ML IJ SOLN
10.0000 mg | Freq: Once | INTRAMUSCULAR | Status: AC
  Administered 2019-06-26: 10 mg via INTRAVENOUS
  Filled 2019-06-25: qty 1

## 2019-06-25 MED ORDER — SODIUM CHLORIDE (PF) 0.9 % IJ SOLN
INTRAMUSCULAR | Status: AC
  Filled 2019-06-25: qty 50

## 2019-06-25 MED ORDER — CELECOXIB 200 MG PO CAPS
200.0000 mg | ORAL_CAPSULE | Freq: Two times a day (BID) | ORAL | Status: DC
  Administered 2019-06-25 – 2019-06-26 (×2): 200 mg via ORAL
  Filled 2019-06-25 (×2): qty 1

## 2019-06-25 MED ORDER — HYDROCODONE-ACETAMINOPHEN 5-325 MG PO TABS
1.0000 | ORAL_TABLET | ORAL | Status: DC | PRN
  Administered 2019-06-26 (×2): 1 via ORAL
  Filled 2019-06-25 (×2): qty 1

## 2019-06-25 MED ORDER — ONDANSETRON HCL 4 MG/2ML IJ SOLN
INTRAMUSCULAR | Status: AC
  Filled 2019-06-25: qty 2

## 2019-06-25 MED ORDER — SENNA 8.6 MG PO TABS
1.0000 | ORAL_TABLET | Freq: Two times a day (BID) | ORAL | Status: DC
  Filled 2019-06-25: qty 1

## 2019-06-25 MED ORDER — PROPOFOL 500 MG/50ML IV EMUL
INTRAVENOUS | Status: AC
  Filled 2019-06-25: qty 50

## 2019-06-25 MED ORDER — BUPIVACAINE-EPINEPHRINE 0.25% -1:200000 IJ SOLN
INTRAMUSCULAR | Status: DC | PRN
  Administered 2019-06-25: 30 mL

## 2019-06-25 MED ORDER — PROPOFOL 10 MG/ML IV BOLUS
INTRAVENOUS | Status: AC
  Filled 2019-06-25: qty 20

## 2019-06-25 MED ORDER — LACTATED RINGERS IV SOLN: INTRAVENOUS | Status: DC

## 2019-06-25 MED ORDER — MORPHINE SULFATE (PF) 2 MG/ML IV SOLN: 0.5000 mg | INTRAVENOUS | Status: DC | PRN

## 2019-06-25 MED ORDER — MIDAZOLAM HCL 2 MG/2ML IJ SOLN
INTRAMUSCULAR | Status: AC
  Filled 2019-06-25: qty 2

## 2019-06-25 MED ORDER — EPHEDRINE 5 MG/ML INJ
INTRAVENOUS | Status: AC
  Filled 2019-06-25: qty 10

## 2019-06-25 MED ORDER — MENTHOL 3 MG MT LOZG: 1.0000 | LOZENGE | OROMUCOSAL | Status: DC | PRN

## 2019-06-25 MED ORDER — OXYCODONE HCL 5 MG/5ML PO SOLN: 5.0000 mg | Freq: Once | ORAL | Status: DC | PRN

## 2019-06-25 MED ORDER — OXYCODONE HCL 5 MG PO TABS: 5.0000 mg | ORAL_TABLET | Freq: Once | ORAL | Status: DC | PRN

## 2019-06-25 MED ORDER — ACETAMINOPHEN 325 MG PO TABS: 325.0000 mg | ORAL_TABLET | Freq: Four times a day (QID) | ORAL | Status: DC | PRN

## 2019-06-25 MED ORDER — STERILE WATER FOR IRRIGATION IR SOLN
Status: DC | PRN
  Administered 2019-06-25: 2000 mL

## 2019-06-25 MED ORDER — PROPOFOL 10 MG/ML IV BOLUS
INTRAVENOUS | Status: DC | PRN
  Administered 2019-06-25 (×2): 20 mg via INTRAVENOUS

## 2019-06-25 MED ORDER — ASPIRIN 81 MG PO CHEW
81.0000 mg | CHEWABLE_TABLET | Freq: Two times a day (BID) | ORAL | Status: DC
  Administered 2019-06-25 – 2019-06-26 (×2): 81 mg via ORAL
  Filled 2019-06-25 (×2): qty 1

## 2019-06-25 MED ORDER — PHENYLEPHRINE HCL (PRESSORS) 10 MG/ML IV SOLN
INTRAVENOUS | Status: AC
  Filled 2019-06-25: qty 1

## 2019-06-25 MED ORDER — POLYETHYLENE GLYCOL 3350 17 G PO PACK: 17.0000 g | PACK | Freq: Every day | ORAL | Status: DC | PRN

## 2019-06-25 MED ORDER — SODIUM CHLORIDE 0.9 % IV SOLN
INTRAVENOUS | Status: DC
  Administered 2019-06-25: 1000 mL via INTRAVENOUS

## 2019-06-25 MED ORDER — DEXAMETHASONE SODIUM PHOSPHATE 10 MG/ML IJ SOLN
INTRAMUSCULAR | Status: DC | PRN
  Administered 2019-06-25: 10 mg via INTRAVENOUS

## 2019-06-25 MED ORDER — ROPIVACAINE HCL 7.5 MG/ML IJ SOLN
INTRAMUSCULAR | Status: DC | PRN
  Administered 2019-06-25: 20 mL via PERINEURAL

## 2019-06-25 MED ORDER — TRANEXAMIC ACID-NACL 1000-0.7 MG/100ML-% IV SOLN
1000.0000 mg | INTRAVENOUS | Status: AC
  Administered 2019-06-25: 1000 mg via INTRAVENOUS
  Filled 2019-06-25: qty 100

## 2019-06-25 MED ORDER — ONDANSETRON HCL 4 MG/2ML IJ SOLN
INTRAMUSCULAR | Status: DC | PRN
  Administered 2019-06-25: 4 mg via INTRAVENOUS

## 2019-06-25 MED ORDER — POVIDONE-IODINE 10 % EX SWAB
2.0000 "application " | Freq: Once | CUTANEOUS | Status: AC
  Administered 2019-06-25: 2 via TOPICAL

## 2019-06-25 MED ORDER — DOCUSATE SODIUM 100 MG PO CAPS
100.0000 mg | ORAL_CAPSULE | Freq: Two times a day (BID) | ORAL | Status: DC
  Filled 2019-06-25: qty 1

## 2019-06-25 MED ORDER — IRRISEPT - 450ML BOTTLE WITH 0.05% CHG IN STERILE WATER, USP 99.95% OPTIME
TOPICAL | Status: DC | PRN
  Administered 2019-06-25: 450 mL

## 2019-06-25 SURGICAL SUPPLY — 70 items
BAG ZIPLOCK 12X15 (MISCELLANEOUS) IMPLANT
BATTERY INSTRU NAVIGATION (MISCELLANEOUS) ×9 IMPLANT
BLADE SAW RECIPROCATING 77.5 (BLADE) ×3 IMPLANT
BNDG ELASTIC 4X5.8 VLCR STR LF (GAUZE/BANDAGES/DRESSINGS) ×3 IMPLANT
BNDG ELASTIC 6X5.8 VLCR STR LF (GAUZE/BANDAGES/DRESSINGS) ×3 IMPLANT
CHLORAPREP W/TINT 26 (MISCELLANEOUS) ×6 IMPLANT
CNTNR URN SCR LID CUP LEK RST (MISCELLANEOUS) ×1 IMPLANT
COMP FEMORAL TRIATHLON SZ3 (Joint) ×3 IMPLANT
COMPONENT FEMRL TRIATHLON SZ3 (Joint) ×1 IMPLANT
CONT SPEC 4OZ STRL OR WHT (MISCELLANEOUS) ×2
COVER SURGICAL LIGHT HANDLE (MISCELLANEOUS) ×3 IMPLANT
COVER WAND RF STERILE (DRAPES) IMPLANT
CUFF TOURN SGL QUICK 34 (TOURNIQUET CUFF) ×3
CUFF TRNQT CYL 34X4.125X (TOURNIQUET CUFF) ×1 IMPLANT
DECANTER SPIKE VIAL GLASS SM (MISCELLANEOUS) ×6 IMPLANT
DERMABOND ADVANCED (GAUZE/BANDAGES/DRESSINGS) ×4
DERMABOND ADVANCED .7 DNX12 (GAUZE/BANDAGES/DRESSINGS) ×2 IMPLANT
DRAPE SHEET LG 3/4 BI-LAMINATE (DRAPES) ×9 IMPLANT
DRAPE U-SHAPE 47X51 STRL (DRAPES) ×3 IMPLANT
DRSG AQUACEL AG ADV 3.5X10 (GAUZE/BANDAGES/DRESSINGS) ×3 IMPLANT
DRSG AQUACEL AG ADV 3.5X14 (GAUZE/BANDAGES/DRESSINGS) ×3 IMPLANT
DRSG TEGADERM 4X4.75 (GAUZE/BANDAGES/DRESSINGS) IMPLANT
ELECT BLADE TIP CTD 4 INCH (ELECTRODE) ×3 IMPLANT
ELECT REM PT RETURN 15FT ADLT (MISCELLANEOUS) ×3 IMPLANT
EVACUATOR 1/8 PVC DRAIN (DRAIN) IMPLANT
GAUZE SPONGE 4X4 12PLY STRL (GAUZE/BANDAGES/DRESSINGS) ×3 IMPLANT
GLOVE BIO SURGEON STRL SZ8.5 (GLOVE) ×6 IMPLANT
GLOVE BIOGEL PI IND STRL 8.5 (GLOVE) ×1 IMPLANT
GLOVE BIOGEL PI INDICATOR 8.5 (GLOVE) ×2
GOWN SPEC L3 XXLG W/TWL (GOWN DISPOSABLE) ×3 IMPLANT
HANDPIECE INTERPULSE COAX TIP (DISPOSABLE) ×3
HOLDER FOLEY CATH W/STRAP (MISCELLANEOUS) ×3 IMPLANT
HOOD PEEL AWAY FLYTE STAYCOOL (MISCELLANEOUS) ×9 IMPLANT
INSERT TIB BEAR SZ3 9 KNEE (Miscellaneous) ×3 IMPLANT
JET LAVAGE IRRISEPT WOUND (IRRIGATION / IRRIGATOR) ×3
KIT TURNOVER KIT A (KITS) IMPLANT
KNEE PATELLA ASYMMETRIC 10X35 (Knees) ×3 IMPLANT
KNEE TIBIAL COMPONENT SZ3 (Knees) ×3 IMPLANT
LAVAGE JET IRRISEPT WOUND (IRRIGATION / IRRIGATOR) ×1 IMPLANT
MARKER SKIN DUAL TIP RULER LAB (MISCELLANEOUS) ×3 IMPLANT
NDL SAFETY ECLIPSE 18X1.5 (NEEDLE) ×1 IMPLANT
NEEDLE HYPO 18GX1.5 SHARP (NEEDLE) ×3
NEEDLE SPNL 18GX3.5 QUINCKE PK (NEEDLE) ×3 IMPLANT
NS IRRIG 1000ML POUR BTL (IV SOLUTION) ×3 IMPLANT
PACK TOTAL KNEE CUSTOM (KITS) ×3 IMPLANT
PADDING CAST COTTON 6X4 STRL (CAST SUPPLIES) ×3 IMPLANT
PENCIL SMOKE EVACUATOR (MISCELLANEOUS) IMPLANT
PIN FLUTED HEDLESS FIX 3.5X1/8 (PIN) ×6 IMPLANT
PROTECTOR NERVE ULNAR (MISCELLANEOUS) ×3 IMPLANT
SAW OSC TIP CART 19.5X105X1.3 (SAW) ×3 IMPLANT
SEALER BIPOLAR AQUA 6.0 (INSTRUMENTS) ×3 IMPLANT
SET HNDPC FAN SPRY TIP SCT (DISPOSABLE) ×1 IMPLANT
SET PAD KNEE POSITIONER (MISCELLANEOUS) ×3 IMPLANT
SPONGE DRAIN TRACH 4X4 STRL 2S (GAUZE/BANDAGES/DRESSINGS) IMPLANT
SUT MNCRL AB 3-0 PS2 18 (SUTURE) ×3 IMPLANT
SUT MNCRL AB 4-0 PS2 18 (SUTURE) ×3 IMPLANT
SUT MON AB 2-0 CT1 36 (SUTURE) ×3 IMPLANT
SUT STRATAFIX PDO 1 14 VIOLET (SUTURE) ×3
SUT STRATFX PDO 1 14 VIOLET (SUTURE) ×1
SUT VIC AB 1 CTX 36 (SUTURE) ×6
SUT VIC AB 1 CTX36XBRD ANBCTR (SUTURE) ×2 IMPLANT
SUT VIC AB 2-0 CT1 27 (SUTURE) ×3
SUT VIC AB 2-0 CT1 TAPERPNT 27 (SUTURE) ×1 IMPLANT
SUTURE STRATFX PDO 1 14 VIOLET (SUTURE) ×1 IMPLANT
SYR 3ML LL SCALE MARK (SYRINGE) ×3 IMPLANT
TOWER CARTRIDGE SMART MIX (DISPOSABLE) IMPLANT
TRAY FOLEY MTR SLVR 14FR STAT (SET/KITS/TRAYS/PACK) ×3 IMPLANT
WATER STERILE IRR 1000ML POUR (IV SOLUTION) ×6 IMPLANT
WRAP KNEE MAXI GEL POST OP (GAUZE/BANDAGES/DRESSINGS) ×3 IMPLANT
YANKAUER SUCT BULB TIP 10FT TU (MISCELLANEOUS) ×3 IMPLANT

## 2019-06-25 NOTE — Transfer of Care (Signed)
Immediate Anesthesia Transfer of Care Note  Patient: Tiffany Dunn  Procedure(s) Performed: COMPUTER ASSISTED TOTAL KNEE ARTHROPLASTY (Left Knee)  Patient Location: PACU  Anesthesia Type:Spinal  Level of Consciousness: awake, alert  and oriented  Airway & Oxygen Therapy: Patient Spontanous Breathing and Patient connected to face mask oxygen  Post-op Assessment: Report given to RN and Post -op Vital signs reviewed and stable  Post vital signs: Reviewed and stable  Last Vitals:  Vitals Value Taken Time  BP 119/59 06/25/19 1137  Temp    Pulse 68 06/25/19 1139  Resp 12 06/25/19 1139  SpO2 100 % 06/25/19 1139  Vitals shown include unvalidated device data.  Last Pain:  Vitals:   06/25/19 0637  TempSrc: Oral         Complications: No apparent anesthesia complications

## 2019-06-25 NOTE — Anesthesia Procedure Notes (Signed)
Anesthesia Regional Block: Adductor canal block   Pre-Anesthetic Checklist: ,, timeout performed, Correct Patient, Correct Site, Correct Laterality, Correct Procedure, Correct Position, site marked, Risks and benefits discussed,  Surgical consent,  Pre-op evaluation,  At surgeon's request and post-op pain management  Laterality: Left  Prep: chloraprep       Needles:  Injection technique: Single-shot  Needle Type: Echogenic Needle     Needle Length: 10cm  Needle Gauge: 21     Additional Needles:   Narrative:  Start time: 06/25/2019 8:02 AM End time: 06/25/2019 8:05 AM Injection made incrementally with aspirations every 5 mL.  Performed by: Personally  Anesthesiologist: Beryle Lathe, MD  Additional Notes: No pain on injection. No increased resistance to injection. Injection made in 5cc increments. Good needle visualization. Patient tolerated the procedure well.

## 2019-06-25 NOTE — Plan of Care (Signed)
Plan of care reviewed and discussed with the patient. 

## 2019-06-25 NOTE — Discharge Instructions (Signed)
° °Dr. Allie Gerhold °Total Joint Specialist °[facility] °[address] °[place], [place] [address] °[phone] ° °TOTAL KNEE REPLACEMENT POSTOPERATIVE DIRECTIONS ° ° ° °Knee Rehabilitation, Guidelines Following Surgery  °Results after knee surgery are often greatly improved when you follow the exercise, range of motion and muscle strengthening exercises prescribed by your doctor. Safety measures are also important to protect the knee from further injury. Any time any of these exercises cause you to have increased pain or swelling in your knee joint, decrease the amount until you are comfortable again and slowly increase them. If you have problems or questions, call your caregiver or physical therapist for advice.  ° °WEIGHT BEARING °Weight bearing as tolerated with assist device (walker, cane, etc) as directed, use it as long as suggested by your surgeon or therapist, typically at least 4-6 weeks. ° °HOME CARE INSTRUCTIONS  °Remove items at home which could result in a fall. This includes throw rugs or furniture in walking pathways.  °Continue medications as instructed at time of discharge. °You may have some home medications which will be placed on hold until you complete the course of blood thinner medication.  °You may start showering once you are discharged home but do not submerge the incision under water. Just pat the incision dry and apply a dry gauze dressing on daily. °Walk with walker as instructed.  °You may resume a sexual relationship in one month or when given the OK by your doctor.  °· Use walker as long as suggested by your caregivers. °· Avoid periods of inactivity such as sitting longer than an hour when not asleep. This helps prevent blood clots.  °You may put full weight on your legs and walk as much as is comfortable.  °You may return to work once you are cleared by your doctor.  °Do not drive a car for 6 weeks or until released by you surgeon.  °· Do not drive  while taking narcotics.  °Wear the elastic stockings for three weeks following surgery during the day but you may remove then at night. °Make sure you keep all of your appointments after your operation with all of your doctors and caregivers. You should call the office at the above phone number and make an appointment for approximately two weeks after the date of your surgery. °Do not remove your surgical dressing. The dressing is waterproof; you may take showers in 3 days, but do not take tub baths or submerge the dressing. °Please pick up a stool softener and laxative for home use as long as you are requiring pain medications. °· ICE to the affected knee every three hours for 30 minutes at a time and then as needed for pain and swelling.  Continue to use ice on the knee for pain and swelling from surgery. You may notice swelling that will progress down to the foot and ankle.  This is normal after surgery.  Elevate the leg when you are not up walking on it.   °It is important for you to complete the blood thinner medication as prescribed by your doctor. °· Continue to use the breathing machine which will help keep your temperature down.  It is common for your temperature to cycle up and down following surgery, especially at night when you are not up moving around and exerting yourself.  The breathing machine keeps your lungs expanded and your temperature down. ° °RANGE OF MOTION AND STRENGTHENING EXERCISES  °Rehabilitation of the knee is important following   a knee injury or an operation. After just a few days of immobilization, the muscles of the thigh which control the knee become weakened and shrink (atrophy). Knee exercises are designed to build up the tone and strength of the thigh muscles and to improve knee motion. Often times heat used for twenty to thirty minutes before working out will loosen up your tissues and help with improving the range of motion but do not use heat for the first two weeks following  surgery. These exercises can be done on a training (exercise) mat, on the floor, on a table or on a bed. Use what ever works the best and is most comfortable for you Knee exercises include:  °Leg Lifts - While your knee is still immobilized in a splint or cast, you can do straight leg raises. Lift the leg to 60 degrees, hold for 3 sec, and slowly lower the leg. Repeat 10-20 times 2-3 times daily. Perform this exercise against resistance later as your knee gets better.  °Quad and Hamstring Sets - Tighten up the muscle on the front of the thigh (Quad) and hold for 5-10 sec. Repeat this 10-20 times hourly. Hamstring sets are done by pushing the foot backward against an object and holding for 5-10 sec. Repeat as with quad sets.  °A rehabilitation program following serious knee injuries can speed recovery and prevent re-injury in the future due to weakened muscles. Contact your doctor or a physical therapist for more information on knee rehabilitation.  ° °SKILLED REHAB INSTRUCTIONS: °If the patient is transferred to a skilled rehab facility following release from the hospital, a list of the current medications will be sent to the facility for the patient to continue.  When discharged from the skilled rehab facility, please have the facility set up the patient's Home Health Physical Therapy prior to being released. Also, the skilled facility will be responsible for providing the patient with their medications at time of release from the facility to include their pain medication, the muscle relaxants, and their blood thinner medication. If the patient is still at the rehab facility at time of the two week follow up appointment, the skilled rehab facility will also need to assist the patient in arranging follow up appointment in our office and any transportation needs. ° °MAKE SURE YOU:  °Understand these instructions.  °Will watch your condition.  °Will get help right away if you are not doing well or get worse.   ° ° °Pick up stool softner and laxative for home use following surgery while on pain medications. °Do NOT remove your dressing. You may shower.  °Do not take tub baths or submerge incision under water. °May shower starting three days after surgery. °Please use a clean towel to pat the incision dry following showers. °Continue to use ice for pain and swelling after surgery. °Do not use any lotions or creams on the incision until instructed by your surgeon. ° °

## 2019-06-25 NOTE — Anesthesia Procedure Notes (Signed)
Spinal  Patient location during procedure: OR Start time: 06/25/2019 8:45 AM Staffing Performed: resident/CRNA  Resident/CRNA: [place] Bazil Dhanani C, CRNA Preanesthetic Checklist Completed: patient identified, IV checked, site marked, risks and benefits discussed, surgical consent, monitors and equipment checked, pre-op evaluation and timeout performed Spinal Block Patient position: sitting Prep: DuraPrep and site prepped and draped Patient monitoring: heart rate, cardiac monitor, continuous pulse ox and blood pressure Approach: midline Location: L3-4 Injection technique: single-shot Needle Needle type: Pencan  Needle gauge: 24 G Needle length: 9 cm Assessment Sensory level: T4 Additional Notes IV functioning, monitors applied to pt. Expiration date of kit checked and confirmed to be in date. Sterile prep and drape, hand hygiene and sterile gloved used. Pt was positioned and spine was prepped in sterile fashion. Skin was anesthetized with lidocaine. Free flow of clear CSF obtained prior to injecting local anesthetic into CSF x 1 attempt. Spinal needle aspirated freely following injection. Needle was carefully withdrawn, and pt tolerated procedure well. Loss of motor and sensory on exam post injection.

## 2019-06-25 NOTE — Op Note (Signed)
OPERATIVE REPORT  SURGEON: Rod Can, MD   ASSISTANT: Nehemiah Massed, PA-C  PREOPERATIVE DIAGNOSIS: Left knee arthritis.   POSTOPERATIVE DIAGNOSIS: Left knee arthritis.   PROCEDURE: Left total knee arthroplasty.   IMPLANTS: Stryker Triathlon CR femur, size 3. Stryker Tritanium tibia, size 3. X3 polyethelyene insert, size 9 mm, CR. 3 button asymmetric patella, size 32 mm.  ANESTHESIA:  MAC, Spinal and Epidural  TOURNIQUET TIME: Not utilized.   ESTIMATED BLOOD LOSS:-300 mL    ANTIBIOTICS: 2 g Ancef.  DRAINS: None.  COMPLICATIONS: None   CONDITION: PACU - hemodynamically stable.   BRIEF CLINICAL NOTE: Tiffany Dunn is a 79 y.o. female with a long-standing history of Left knee arthritis. After failing conservative management, the patient was indicated for total knee arthroplasty. The risks, benefits, and alternatives to the procedure were explained, and the patient elected to proceed.  PROCEDURE IN DETAIL: Adductor canal block was obtained in the pre-op holding area. Once inside the operative room, spinal anesthesia was obtained, and a foley catheter was inserted. The patient was then positioned, a nonsterile tourniquet was placed, and the lower extremity was prepped and draped in the normal sterile surgical fashion.  A time-out was called verifying side and site of surgery. The patient received IV antibiotics within 60 minutes of beginning the procedure. The tourniquet was not utilized.   An anterior approach to the knee was performed utilizing a midvastus arthrotomy. A medial release was performed and the patellar fat pad was excised. Stryker navigation was used to cut the distal femur perpendicular to the mechanical axis. A freehand patellar resection was performed, and the patella was sized an prepared with 3 lug holes.  Nagivation was used to make a neutral proximal tibia  resection, taking 4 mm of bone from the less affected medial side with 3 degrees of slope. The menisci were excised. A spacer block was placed, and the alignment and balance in extension were confirmed.   The distal femur was sized using the 3-degree external rotation guide referencing the posterior femoral cortex. The appropriate 4-in-1 cutting block was pinned into place. Rotation was checked using Whiteside's line, the epicondylar axis, and then confirmed with a spacer block in flexion. The remaining femoral cuts were performed, taking care to protect the MCL.  The tibia was sized and the trial tray was pinned into place. The remaining trail components were inserted. The knee was stable to varus and valgus stress through a full range of motion. The patella tracked centrally, and the PCL was well balanced. The trial components were removed, and the proximal tibial surface was prepared. Final components were impacted into place. The knee was tested for a final time and found to be well balanced.   The wound was copiously irrigated with Irrisept solution and normal saline using pule lavage.  Marcaine solution was injected into the periarticular soft tissue.  The wound was closed in layers using #1 Vicryl and Stratafix for the fascia, 2-0 Vicryl for the subcutaneous fat, 2-0 Monocryl for the deep dermal layer, 3-0 running Monocryl subcuticular Stitch, and 4-0 Monocryl stay sutures at both ends of the wound. Dermabond was applied to the skin.  Once the glue was fully dried, an Aquacell Ag and compressive dressing were applied.  Tthe patient was transported to the recovery room in stable condition.  Sponge, needle, and instrument counts were correct at the end of the case x2.  The patient tolerated the procedure well and there were no known complications.  Please note that  a surgical assistant was a medical necessity for this procedure in order to perform it in a safe and expeditious manner. Surgical assistant  was necessary to retract the ligaments and vital neurovascular structures to prevent injury to them and also necessary for proper positioning of the limb to allow for anatomic placement of the prosthesis.

## 2019-06-25 NOTE — Anesthesia Postprocedure Evaluation (Signed)
Anesthesia Post Note  Patient: Tiffany Dunn  Procedure(s) Performed: COMPUTER ASSISTED TOTAL KNEE ARTHROPLASTY (Left Knee)     Patient location during evaluation: PACU Anesthesia Type: Spinal Level of consciousness: awake and alert Pain management: pain level controlled Vital Signs Assessment: post-procedure vital signs reviewed and stable Respiratory status: spontaneous breathing and respiratory function stable Cardiovascular status: blood pressure returned to baseline and stable Postop Assessment: spinal receding and no apparent nausea or vomiting Anesthetic complications: no    Last Vitals:  Vitals:   06/25/19 1200 06/25/19 1214  BP: (!) 117/103 124/63  Pulse: 65 65  Resp: 18 16  Temp: 37 C   SpO2: 100% 100%    Last Pain:  Vitals:   06/25/19 1200  TempSrc:   PainSc: 0-No pain                 Beryle Lathe

## 2019-06-25 NOTE — Interval H&P Note (Signed)
History and Physical Interval Note:  06/25/2019 8:06 AM  Tiffany Dunn  has presented today for surgery, with the diagnosis of degenerative joint disease left knee.  The various methods of treatment have been discussed with the patient and family. After consideration of risks, benefits and other options for treatment, the patient has consented to  Procedure(s): COMPUTER ASSISTED TOTAL KNEE ARTHROPLASTY (Left) as a surgical intervention.  The patient's history has been reviewed, patient examined, no change in status, stable for surgery.  I have reviewed the patient's chart and labs.  Questions were answered to the patient's satisfaction.     Iline Oven Dewel Lotter

## 2019-06-25 NOTE — Evaluation (Signed)
Physical Therapy Evaluation Patient Details Name: Tiffany Dunn MRN: [id number] DOB: [birth year] Today's Date: 06/25/2019   History of Present Illness  Patient is 79 y.o. 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  Clinical Impression  Tiffany Dunn is a 79 y.o. female POD 0 s/p Lt TKA. Patient reports independence with mobility at baseline. Patient is now limited by functional impairments (see PT problem list below) and requires min assist for transfers and gait with RW. Patient was able to ambulate ~40 feet with RW and min assist. Gait distance limited as pt reported some lightheadedness. Patient instructed in exercise to facilitate ROM and circulation. Patient will benefit from continued skilled PT interventions to address impairments and progress towards PLOF. Acute PT will follow to progress mobility and stair training in preparation for safe discharge home.     Follow Up Recommendations Follow surgeon's recommendation for DC plan and follow-up therapies    Equipment Recommendations  None recommended by PT(pt has RW)    Recommendations for Other Services       Precautions / Restrictions Precautions Precautions: Fall Restrictions Weight Bearing Restrictions: No Other Position/Activity Restrictions: WBAT      Mobility  Bed Mobility Overal bed mobility: Needs Assistance Bed Mobility: Supine to Sit     Supine to sit: Min assist;HOB elevated     General bed mobility comments: cues for using bed rail, no assist needed to mobilize LE's however light assist for raising trunk upright.  Transfers Overall transfer level: Needs assistance Equipment used: Rolling walker (2 wheeled) Transfers: Sit to/from Stand Sit to Stand: Min assist         General transfer comment: cues for safe hand placement and technique with RW, assist for power up. pt steady with rising.  Ambulation/Gait Ambulation/Gait assistance: Min assist Gait  Distance (Feet): 40 Feet Assistive device: Rolling walker (2 wheeled) Gait Pattern/deviations: Step-to pattern;Decreased stride length;Decreased step length - left;Decreased weight shift to left Gait velocity: decreased   General Gait Details: pt required min assist to steady during gait and cues for safe step pattern. pt required repeated cues and assist to manage walker position.   Stairs       Wheelchair Mobility    Modified Rankin (Stroke Patients Only)       Balance Overall balance assessment: Needs assistance Sitting-balance support: Feet supported;Bilateral upper extremity supported Sitting balance-Leahy Scale: Good     Standing balance support: During functional activity;Bilateral upper extremity supported Standing balance-Leahy Scale: Poor            Pertinent Vitals/Pain Pain Assessment: 0-10 Pain Score: 0-No pain Pain Location: Lt knee Pain Intervention(s): Limited activity within patient's tolerance;Monitored during session;Repositioned;Ice applied    Home Living Family/patient expects to be discharged to:: Private residence Living Arrangements: Spouse/significant other Available Help at Discharge: Family Type of Home: House Home Access: Stairs to enter Entrance Stairs-Rails: Psychiatric nurse of Steps: 3 Home Layout: One level Home Equipment: Grab bars - tub/shower;Shower seat;Cane - single point;Walker - 2 wheels Additional Comments: pt also has friends and church family that can help her out as needed.    Prior Function Level of Independence: Independent           Hand Dominance   Dominant Hand: Right    Extremity/Trunk Assessment   Upper Extremity Assessment Upper Extremity Assessment: Overall WFL for tasks assessed    Lower Extremity Assessment Lower Extremity Assessment: Overall WFL for tasks assessed  Cervical / Trunk Assessment Cervical / Trunk Assessment: Normal  Communication   Communication: No  difficulties  Cognition Arousal/Alertness: Awake/alert Behavior During Therapy: WFL for tasks assessed/performed Overall Cognitive Status: Within Functional Limits for tasks assessed         General Comments      Exercises Total Joint Exercises Ankle Circles/Pumps: AROM;Both;20 reps Quad Sets: AROM;10 reps;Left;Seated Heel Slides: AROM;Left;10 reps;Seated   Assessment/Plan    PT Assessment Patient needs continued PT services  PT Problem List Decreased strength;Decreased range of motion;Decreased activity tolerance;Decreased balance;Decreased mobility;Decreased knowledge of use of DME       PT Treatment Interventions Gait training;DME instruction;Functional mobility training;Stair training;Therapeutic activities;Therapeutic exercise;Balance training;Patient/family education    PT Goals (Current goals can be found in the Care Plan section)  Acute Rehab PT Goals Patient Stated Goal: to get back to walking [facility] Goal Formulation: With patient Time For Goal Achievement: 07/02/19 Potential to Achieve Goals: Good    Frequency 7X/week    AM-PAC PT "6 Clicks" Mobility  Outcome Measure Help needed turning from your back to your side while in a flat bed without using bedrails?: None Help needed moving from lying on your back to sitting on the side of a flat bed without using bedrails?: A Little Help needed moving to and from a bed to a chair (including a wheelchair)?: A Little Help needed standing up from a chair using your arms (e.g., wheelchair or bedside chair)?: A Little Help needed to walk in hospital room?: A Little Help needed climbing 3-5 steps with a railing? : A Little 6 Click Score: 19    End of Session Equipment Utilized During Treatment: Gait belt Activity Tolerance: Patient tolerated treatment well Patient left: in chair;with call bell/phone within reach;with chair alarm set;with family/visitor present Nurse Communication: Mobility  status PT Visit Diagnosis: Muscle weakness (generalized) (M62.81);Difficulty in walking, not elsewhere classified (R26.2)    Time: 1445-1501 PT Time Calculation (min) (ACUTE ONLY): 16 min   Charges:   PT Evaluation $PT Eval Low Complexity: 1 Low         Wynn Maudlin, DPT Physical Therapist with [facility] [phone]  06/25/2019 4:07 PM

## 2019-06-26 ENCOUNTER — Encounter: Payer: Self-pay | Admitting: *Deleted

## 2019-06-26 DIAGNOSIS — M1712 Unilateral primary osteoarthritis, left knee: Secondary | ICD-10-CM | POA: Diagnosis not present

## 2019-06-26 LAB — BASIC METABOLIC PANEL
Anion gap: 7 (ref 5–15)
BUN: 20 mg/dL (ref 8–23)
CO2: 23 mmol/L (ref 22–32)
Calcium: 8 mg/dL — ABNORMAL LOW (ref 8.9–10.3)
Chloride: 109 mmol/L (ref 98–111)
Creatinine, Ser: 1.01 mg/dL — ABNORMAL HIGH (ref 0.44–1.00)
GFR calc Af Amer: >60 mL/min (ref >60)
GFR calc non Af Amer: 54 mL/min — ABNORMAL LOW (ref >60)
Glucose, Bld: 136 mg/dL — ABNORMAL HIGH (ref 70–99)
Potassium: 4.4 mmol/L (ref 3.5–5.1)
Sodium: 139 mmol/L (ref 135–145)

## 2019-06-26 LAB — CBC
HCT: 32.4 % — ABNORMAL LOW (ref 36.0–46.0)
Hemoglobin: 9.9 g/dL — ABNORMAL LOW (ref 12.0–15.0)
MCH: 27.2 pg (ref 26.0–34.0)
MCHC: 30.6 g/dL (ref 30.0–36.0)
MCV: 89 fL (ref 80.0–100.0)
Platelets: 151 10*3/uL (ref 150–400)
RBC: 3.64 MIL/uL — ABNORMAL LOW (ref 3.87–5.11)
RDW: 15.7 % — ABNORMAL HIGH (ref 11.5–15.5)
WBC: 9.7 10*3/uL (ref 4.0–10.5)
nRBC: 0 % (ref 0.0–0.2)

## 2019-06-26 MED ORDER — HYDROCODONE-ACETAMINOPHEN 5-325 MG PO TABS: 1.0000 | ORAL_TABLET | ORAL | 0 refills | Status: AC | PRN

## 2019-06-26 MED ORDER — DOCUSATE SODIUM 100 MG PO CAPS: 100.0000 mg | ORAL_CAPSULE | Freq: Two times a day (BID) | ORAL | 1 refills | Status: AC

## 2019-06-26 MED ORDER — ASPIRIN 81 MG PO CHEW: 81.0000 mg | CHEWABLE_TABLET | Freq: Two times a day (BID) | ORAL | 0 refills | Status: AC

## 2019-06-26 MED ORDER — SENNA 8.6 MG PO TABS: 2.0000 | ORAL_TABLET | Freq: Every day | ORAL | 1 refills | Status: AC

## 2019-06-26 MED ORDER — CELECOXIB 200 MG PO CAPS: 200.0000 mg | ORAL_CAPSULE | Freq: Every day | ORAL | 2 refills | Status: AC

## 2019-06-26 MED ORDER — ONDANSETRON HCL 4 MG PO TABS: 4.0000 mg | ORAL_TABLET | Freq: Four times a day (QID) | ORAL | 0 refills | Status: AC | PRN

## 2019-06-26 NOTE — Progress Notes (Signed)
Physical Therapy Treatment Patient Details Name: Tiffany Dunn MRN: [id number] DOB: 1940/12/02 Today's Date: 06/26/2019    History of Present Illness Patient is 79 y.o. 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    PT Comments    Pt progressing well with mobility and eager for dc home.  Pt ambulating increased distance in halls and negotiated stairs with cane and rail - written instruction provided.  Multiple questions asked and answered.   Follow Up Recommendations  Follow surgeon's recommendation for DC plan and follow-up therapies     Equipment Recommendations  None recommended by PT    Recommendations for Other Services       Precautions / Restrictions Precautions Precautions: Fall Restrictions Weight Bearing Restrictions: No Other Position/Activity Restrictions: WBAT    Mobility  Bed Mobility               General bed mobility comments: Pt up in chair and requests back to same  Transfers Overall transfer level: Needs assistance Equipment used: Rolling walker (2 wheeled) Transfers: Sit to/from Stand Sit to Stand: Min guard;Supervision         General transfer comment: cues for LE management and use of UEs to self assist  Ambulation/Gait Ambulation/Gait assistance: Min guard;Supervision Gait Distance (Feet): 150 Feet Assistive device: Rolling walker (2 wheeled) Gait Pattern/deviations: Step-to pattern;Decreased stride length;Decreased step length - left;Decreased weight shift to left Gait velocity: decreased   General Gait Details: cues for posture, position from RW and initial sequence   Stairs Stairs: Yes Stairs assistance: Min assist Stair Management: One rail Right;Step to pattern;Forwards;With cane Number of Stairs: 5 General stair comments: cues for sequence and foot/cane placement   Wheelchair Mobility    Modified Rankin (Stroke Patients Only)       Balance Overall balance  assessment: Needs assistance Sitting-balance support: Feet supported;Bilateral upper extremity supported Sitting balance-Leahy Scale: Good     Standing balance support: During functional activity;Bilateral upper extremity supported Standing balance-Leahy Scale: Fair                              Cognition Arousal/Alertness: Awake/alert Behavior During Therapy: WFL for tasks assessed/performed Overall Cognitive Status: Within Functional Limits for tasks assessed                                        Exercises Total Joint Exercises Ankle Circles/Pumps: AROM;Both;20 reps Quad Sets: AROM;10 reps;Left;Supine Heel Slides: AROM;Left;15 reps;Supine Straight Leg Raises: AAROM;AROM;Left;10 reps;Supine Long Arc Quad: AAROM;AROM;Left;10 reps;Seated Knee Flexion: AROM;AAROM;Left;10 reps;Seated    General Comments        Pertinent Vitals/Pain Pain Assessment: 0-10 Pain Score: 4  Pain Location: Lt knee Pain Descriptors / Indicators: Aching;Sore Pain Intervention(s): Limited activity within patient's tolerance;Monitored during session;Premedicated before session;Ice applied    Home Living                      Prior Function            PT Goals (current goals can now be found in the care plan section) Acute Rehab PT Goals Patient Stated Goal: to [facility] Goal Formulation: With patient Time For Goal Achievement: 07/02/19 Potential to Achieve Goals: Good Progress towards PT goals:  Progressing toward goals    Frequency    7X/week      PT Plan Current plan remains appropriate    Co-evaluation              AM-PAC PT "6 Clicks" Mobility   Outcome Measure  Help needed turning from your back to your side while in a flat bed without using bedrails?: A Little Help needed moving from lying on your back to sitting on the side of a flat bed without using bedrails?: A Little Help needed moving  to and from a bed to a chair (including a wheelchair)?: A Little Help needed standing up from a chair using your arms (e.g., wheelchair or bedside chair)?: A Little Help needed to walk in hospital room?: A Little Help needed climbing 3-5 steps with a railing? : A Little 6 Click Score: 18    End of Session Equipment Utilized During Treatment: Gait belt Activity Tolerance: Patient tolerated treatment well Patient left: in chair;with call bell/phone within reach;with chair alarm set;with family/visitor present Nurse Communication: Mobility status PT Visit Diagnosis: Muscle weakness (generalized) (M62.81);Difficulty in walking, not elsewhere classified (R26.2)     Time: 4970-2637 PT Time Calculation (min) (ACUTE ONLY): 26 min  Charges:  $Gait Training: 8-22 mins $Therapeutic Exercise: 23-37 mins $Therapeutic Activity: 8-22 mins                     Mauro Kaufmann PT Acute Rehabilitation Services Pager [phone] Office [phone]    Dionte Blaustein 06/26/2019, 1:25 PM

## 2019-06-26 NOTE — Discharge Summary (Signed)
Physician Discharge Summary  Patient ID: SONNET RIZOR MRN: [id number] DOB/AGE: 1940/06/13 79 y.o.  Admit date: 06/25/2019 Discharge date: 06/26/2019  Admission Diagnoses:  Osteoarthritis of left knee  Discharge Diagnoses:  Principal Problem:   Osteoarthritis of left knee   Past Medical History:  Diagnosis Date  . AAA (abdominal aortic aneurysm) ([place])   . Arthritis   . Hypertension     Surgeries: Procedure(s): COMPUTER ASSISTED TOTAL KNEE ARTHROPLASTY on 06/25/2019   Consultants (if any):   Discharged Condition: Improved  Hospital Course: RYLIE LIMBURG is an 79 y.o. female who was admitted 06/25/2019 with a diagnosis of Osteoarthritis of left knee and went to the operating room on 06/25/2019 and underwent the above named procedures.    She was given perioperative antibiotics:  Anti-infectives (From admission, onward)   Start     Dose/Rate Route Frequency Ordered Stop   06/25/19 1400  ceFAZolin (ANCEF) IVPB 2g/100 mL premix     2 g 200 mL/hr over 30 Minutes Intravenous Every 6 hours 06/25/19 1231 06/25/19 2127   06/25/19 0645  ceFAZolin (ANCEF) IVPB 2g/100 mL premix     2 g 200 mL/hr over 30 Minutes Intravenous On call to O.R. 06/25/19 1443 06/25/19 1540    .  She was given sequential compression devices, early ambulation, and ASA for DVT prophylaxis.  She benefited maximally from the hospital stay and there were no complications.    Recent vital signs:  Vitals:   06/26/19 0614 06/26/19 0940  BP: (!) 147/79 (!) 93/58  Pulse: 68 73  Resp: 18 18  Temp: 97.8 F (36.6 C) 98.7 F (37.1 C)  SpO2: 100% 100%    Recent laboratory studies:  Lab Results  Component Value Date   HGB 9.9 (L) 06/26/2019   HGB 13.7 06/24/2019   Lab Results  Component Value Date   WBC 9.7 06/26/2019   PLT 151 06/26/2019   Lab Results  Component Value Date   INR 1.0 06/24/2019   Lab Results  Component Value Date   NA 139 06/26/2019   K 4.4 06/26/2019   CL 109 06/26/2019   CO2 23  06/26/2019   BUN 20 06/26/2019   CREATININE 1.01 (H) 06/26/2019   GLUCOSE 136 (H) 06/26/2019    Discharge Medications:   Allergies as of 06/26/2019      Reactions   Ultram [tramadol Hcl] Anaphylaxis, Nausea And Vomiting      Medication List    STOP taking these medications   aspirin EC 81 MG tablet Replaced by: aspirin 81 MG chewable tablet   naproxen sodium 220 MG tablet Commonly known as: ALEVE     TAKE these medications   aspirin 81 MG chewable tablet Chew 1 tablet (81 mg total) by mouth 2 (two) times daily. Replaces: aspirin EC 81 MG tablet   celecoxib 200 MG capsule Commonly known as: CELEBREX Take 1 capsule (200 mg total) by mouth daily.   cholecalciferol 25 MCG (1000 UNIT) tablet Commonly known as: VITAMIN D Take 1,000 Units by mouth daily.   docusate sodium 100 MG capsule Commonly known as: COLACE Take 1 capsule (100 mg total) by mouth 2 (two) times daily.   Fish Oil 500 MG Caps Take 500 mg by mouth every evening.   HYDROcodone-acetaminophen 5-325 MG tablet Commonly known as: NORCO/VICODIN Take 1 tablet by mouth every 4 (four) hours as needed for moderate pain (pain score 4-6).   metoprolol succinate 50 MG 24 hr tablet Commonly known as: TOPROL-XL Take 50 mg  by mouth daily in the afternoon. Take with or immediately following a meal.   ondansetron 4 MG tablet Commonly known as: ZOFRAN Take 1 tablet (4 mg total) by mouth every 6 (six) hours as needed for nausea.   rosuvastatin 20 MG tablet Commonly known as: CRESTOR Take 20 mg by mouth every evening.   senna 8.6 MG Tabs tablet Commonly known as: SENOKOT Take 2 tablets (17.2 mg total) by mouth at bedtime.       Diagnostic Studies: DG Knee Left Port  Result Date: 06/25/2019 CLINICAL DATA:  Left knee arthroplasty EXAM: PORTABLE LEFT KNEE - 1-2 VIEW COMPARISON:  None. FINDINGS: Postsurgical changes from left knee arthroplasty. Arthroplasty components appear in their expected location without  evidence of at periprosthetic fracture. Expected postoperative changes within the overlying soft tissues. Vascular calcifications are present. IMPRESSION: Expected postoperative changes from left knee arthroplasty without evidence of periprosthetic fracture. Electronically Signed   By: Duanne Guess D.O.   On: 06/25/2019 12:34    Disposition: Discharge disposition: 01-Home or Self Care       Discharge Instructions    Call MD / Call 911   Complete by: As directed    If you experience chest pain or shortness of breath, CALL 911 and be transported to the hospital emergency room.  If you develope a fever above 101 F, pus (white drainage) or increased drainage or redness at the wound, or calf pain, call your surgeon's office.   Constipation Prevention   Complete by: As directed    Drink plenty of fluids.  Prune juice may be helpful.  You may use a stool softener, such as Colace (over the counter) 100 mg twice a day.  Use MiraLax (over the counter) for constipation as needed.   Diet - low sodium heart healthy   Complete by: As directed    Do not put a pillow under the knee. Place it under the heel.   Complete by: As directed    Driving restrictions   Complete by: As directed    No driving for 6 weeks   Increase activity slowly as tolerated   Complete by: As directed    Lifting restrictions   Complete by: As directed    No lifting for 6 weeks   TED hose   Complete by: As directed    Use stockings (TED hose) for 2 weeks on both leg(s).  You may remove them at night for sleeping.      Follow-up Information    Lorena Benham, Arlys John, MD. Schedule an appointment as soon as possible for a visit in 2 weeks.   Specialty: Orthopedic Surgery Why: For wound re-check Contact information: [address] [place] [address] [place] [address] [phone]            Signed: Iline Oven Verneta Hamidi 06/26/2019, 12:20 PM

## 2019-06-26 NOTE — TOC Progression Note (Signed)
Transition of Care [facility]) - Progression Note    Patient Details  Name: SHELBI VACCARO MRN: [id number] Date of Birth: 26-Jul-1940  [facility]) CM/SW Contact  Clearance Coots, LCSW Phone Number: 06/26/2019, 9:30 AM  Clinical Narrative:    Therapy Plan: OPPT Has DME      Barriers to Discharge: No Barriers Identified  Expected Discharge Plan and Services                           DME Arranged: N/A DME Agency: NA(HAS RW and High [place] seats, Has grab bars in shower/shower seat)       HH Arranged: NA HH Agency: NA         Social Determinants of Health (SDOH) Interventions    Readmission Risk Interventions No flowsheet data found.

## 2019-06-26 NOTE — Progress Notes (Signed)
RN reviewed discharge instructions with patient and family.   All questions answered.    Paperwork given.   Prescriptions sent to pharmacy by MD.      Heron Sabins, RN

## 2019-06-26 NOTE — Progress Notes (Signed)
Physical Therapy Treatment Patient Details Name: Tiffany Dunn MRN: [id number] DOB: [birth year] Today's Date: 06/26/2019    History of Present Illness Patient is 79 y.o. 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    PT Comments    Pt performed HEP therex program with written instruction provided and reviewed.  Follow Up Recommendations  Follow surgeon's recommendation for DC plan and follow-up therapies     Equipment Recommendations  None recommended by PT    Recommendations for Other Services       Precautions / Restrictions Precautions Precautions: Fall Restrictions Weight Bearing Restrictions: No Other Position/Activity Restrictions: WBAT    Mobility  Bed Mobility                  Transfers                    Ambulation/Gait                 Stairs             Wheelchair Mobility    Modified Rankin (Stroke Patients Only)       Balance                                            Cognition Arousal/Alertness: Awake/alert Behavior During Therapy: WFL for tasks assessed/performed Overall Cognitive Status: Within Functional Limits for tasks assessed                                        Exercises Total Joint Exercises Ankle Circles/Pumps: AROM;Both;20 reps Quad Sets: AROM;10 reps;Left;Supine Heel Slides: AROM;Left;15 reps;Supine Straight Leg Raises: AAROM;AROM;Left;10 reps;Supine Long Arc Quad: AAROM;AROM;Left;10 reps;Seated Knee Flexion: AROM;AAROM;Left;10 reps;Seated    General Comments        Pertinent Vitals/Pain Pain Assessment: 0-10 Pain Score: 4  Pain Location: Lt knee Pain Descriptors / Indicators: Aching;Sore Pain Intervention(s): Limited activity within patient's tolerance;Monitored during session;Premedicated before session;Ice applied    Home Living                      Prior Function            PT  Goals (current goals can now be found in the care plan section) Acute Rehab PT Goals Patient Stated Goal: to [facility] Goal Formulation: With patient Time For Goal Achievement: 07/02/19 Potential to Achieve Goals: Good Progress towards PT goals: Progressing toward goals    Frequency    7X/week      PT Plan Current plan remains appropriate    Co-evaluation              AM-PAC PT "6 Clicks" Mobility   Outcome Measure  Help needed turning from your back to your side while in a flat bed without using bedrails?: A Little Help needed moving from lying on your back to sitting on the side of a flat bed without using bedrails?: A Little Help needed moving to and from a bed to a chair (including a wheelchair)?: A Little Help needed standing up from a chair using your arms (e.g., wheelchair or bedside chair)?: A Little Help needed to walk  in hospital room?: A Little Help needed climbing 3-5 steps with a railing? : A Little 6 Click Score: 18    End of Session Equipment Utilized During Treatment: Gait belt Activity Tolerance: Patient tolerated treatment well Patient left: in chair;with call bell/phone within reach;with chair alarm set;with family/visitor present   PT Visit Diagnosis: Muscle weakness (generalized) (M62.81);Difficulty in walking, not elsewhere classified (R26.2)     Time: 4580-9983 PT Time Calculation (min) (ACUTE ONLY): 24 min  Charges:  $Therapeutic Exercise: 23-37 mins                     [place] Pager [phone] Office [phone]    Daritza Brees 06/26/2019, 1:21 PM

## 2019-06-26 NOTE — Progress Notes (Signed)
    Subjective:  Patient reports pain as mild.  Denies N/V/CP/SOB. No c/o. Eager to go home.  Objective:   VITALS:   Vitals:   06/25/19 2157 06/26/19 0222 06/26/19 0614 06/26/19 0940  BP: 125/83 132/73 (!) 147/79 (!) 93/58  Pulse: 66 69 68 73  Resp: 16 18 18 18   Temp: 98.6 F (37 C) 98.7 F (37.1 C) 97.8 F (36.6 C) 98.7 F (37.1 C)  TempSrc: Oral Oral Oral Oral  SpO2: 96% 99% 100% 100%  Weight:      Height:        NAD ABD soft Sensation intact distally Intact pulses distally Dorsiflexion/Plantar flexion intact Incision: dressing C/D/I Compartment soft   Lab Results  Component Value Date   WBC 9.7 06/26/2019   HGB 9.9 (L) 06/26/2019   HCT 32.4 (L) 06/26/2019   MCV 89.0 06/26/2019   PLT 151 06/26/2019   BMET    Component Value Date/Time   NA 139 06/26/2019 0322   K 4.4 06/26/2019 0322   CL 109 06/26/2019 0322   CO2 23 06/26/2019 0322   GLUCOSE 136 (H) 06/26/2019 0322   BUN 20 06/26/2019 0322   CREATININE 1.01 (H) 06/26/2019 0322   CREATININE 0.91 11/14/2013 0854   CALCIUM 8.0 (L) 06/26/2019 0322   GFRNONAA 54 (L) 06/26/2019 0322   GFRAA >60 06/26/2019 0322     Assessment/Plan: 1 Day Post-Op   Principal Problem:   Osteoarthritis of left knee   WBAT with walker DVT ppx: Aspirin, SCDs, TEDS PO pain control PT Dispo: D/C home with OPPT  06/28/2019 Azelie Noguera 06/26/2019, 12:15 PM   06/28/2019, MD [phone] [facility] is now [facility]  [place] [address], [place], [place] Phone: [phone] [email or web address] Facebook  [phone]

## 2020-07-31 IMAGING — DX DG KNEE 1-2V PORT*L*
2 series · 2 of 2 positions shown · non-contrast
Comparison: None.

CLINICAL DATA: Left knee arthroplasty

EXAM:
PORTABLE LEFT KNEE - 1-2 VIEW

[knee lat]
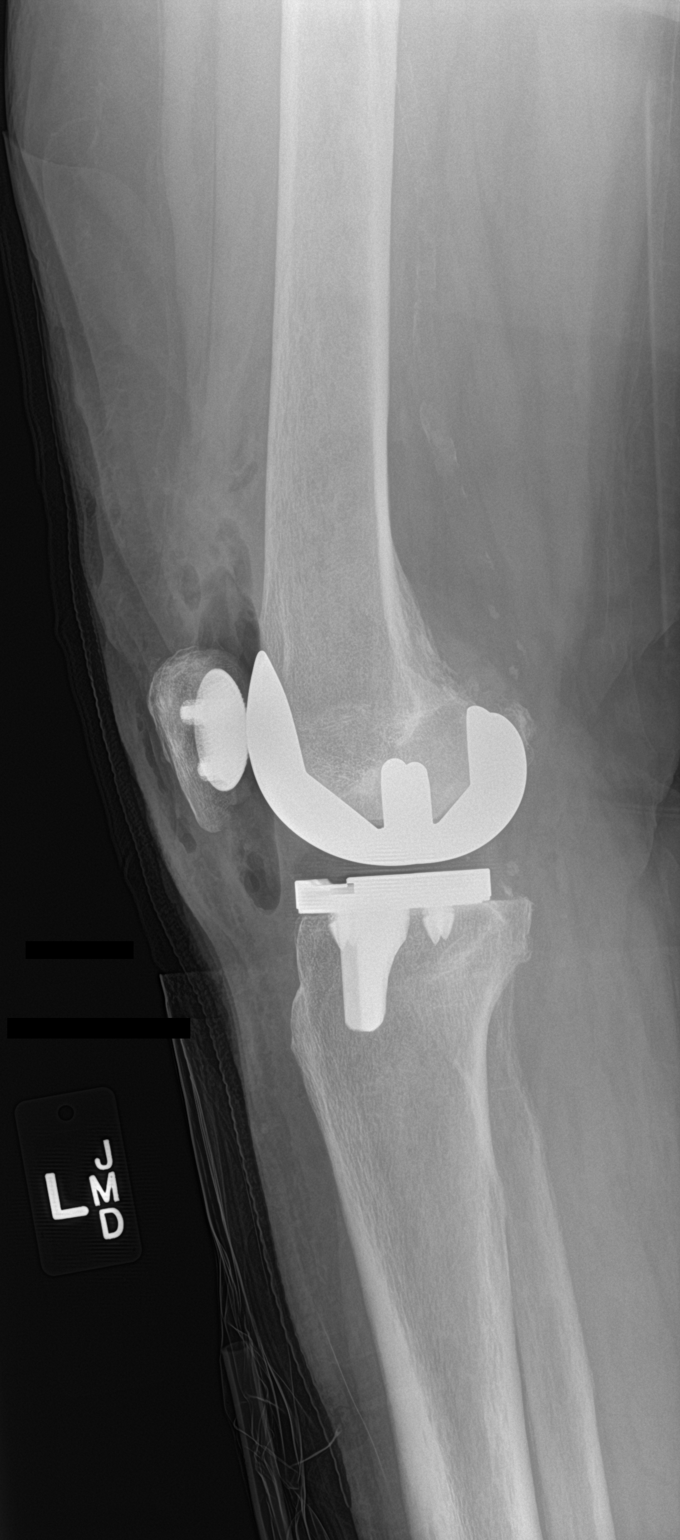

[knee ap]
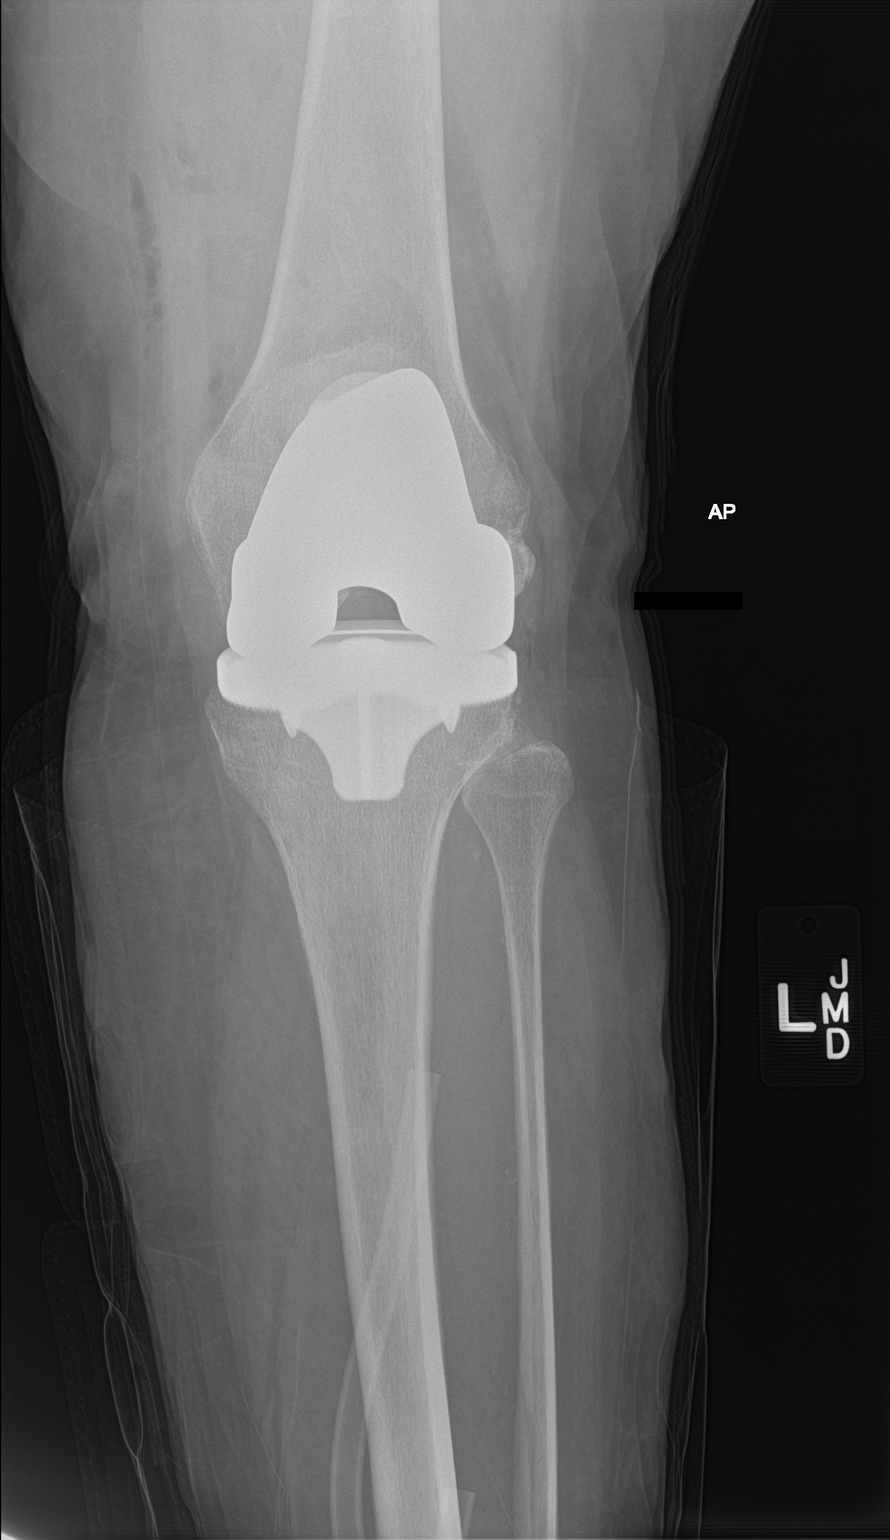

[2 of 2 positions shown; findings below may reference images not displayed]

FINDINGS: Postsurgical changes from left knee arthroplasty. Arthroplasty
components appear in their expected location without evidence of at
periprosthetic fracture. Expected postoperative changes within the
overlying soft tissues. Vascular calcifications are present.
IMPRESSION: Expected postoperative changes from left knee arthroplasty without
evidence of periprosthetic fracture.
